# Patient Record
Sex: Female | Born: 1985 | Race: Black or African American | Hispanic: No | Marital: Single
Health system: Midwestern US, Academic
[De-identification: ages and names within clinical notes are randomized; demographics above are authoritative.]

## PROBLEM LIST (undated history)

## (undated) DIAGNOSIS — N920 Excessive and frequent menstruation with regular cycle: Secondary | ICD-10-CM

## (undated) DIAGNOSIS — D500 Iron deficiency anemia secondary to blood loss (chronic): Secondary | ICD-10-CM

## (undated) DIAGNOSIS — R300 Dysuria: Secondary | ICD-10-CM

## (undated) DIAGNOSIS — G8918 Other acute postprocedural pain: Secondary | ICD-10-CM

## (undated) DIAGNOSIS — R5383 Other fatigue: Secondary | ICD-10-CM

## (undated) DIAGNOSIS — D509 Iron deficiency anemia, unspecified: Secondary | ICD-10-CM

## (undated) DIAGNOSIS — R102 Pelvic and perineal pain: Secondary | ICD-10-CM

## (undated) HISTORY — PX: EXTERNAL EAR SURGERY: SHX627

---
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
## 2007-08-27 ENCOUNTER — Emergency Department (HOSPITAL_COMMUNITY): Admission: EM | Admit: 2007-08-27 | Discharge: 2007-08-27 | Payer: Self-pay | Admitting: *Deleted

## 2008-08-09 ENCOUNTER — Emergency Department (HOSPITAL_COMMUNITY): Admission: EM | Admit: 2008-08-09 | Discharge: 2008-08-09 | Payer: Self-pay | Admitting: Emergency Medicine

## 2008-09-17 ENCOUNTER — Emergency Department (HOSPITAL_COMMUNITY): Admission: EM | Admit: 2008-09-17 | Discharge: 2008-09-17 | Payer: Self-pay | Admitting: Otolaryngology

## 2010-02-10 ENCOUNTER — Encounter: Payer: Self-pay | Admitting: Gastroenterology

## 2010-02-11 ENCOUNTER — Encounter: Payer: Self-pay | Admitting: Gastroenterology

## 2010-04-25 ENCOUNTER — Emergency Department (HOSPITAL_COMMUNITY): Admission: EM | Admit: 2010-04-25 | Discharge: 2010-04-25 | Payer: Self-pay | Admitting: Emergency Medicine

## 2010-11-07 ENCOUNTER — Emergency Department (HOSPITAL_COMMUNITY)
Admission: EM | Admit: 2010-11-07 | Discharge: 2010-11-08 | Payer: Self-pay | Source: Home / Self Care | Admitting: Emergency Medicine

## 2011-01-04 NOTE — Medication Information (Signed)
Summary: Tax adviser   Imported By: Diana Eves 02/11/2010 09:48:32  _____________________________________________________________________  External Attachment:    Type:   Image     Comment:   External Document  Appended Document: RX Folder Please look in chart on this one. If we have not seen in two years, we cannot refill.  Appended Document: RX Folder Actually this is scanned is wrong chart. Please fix.

## 2011-01-07 NOTE — Medication Information (Signed)
Summary: Tax adviser   Imported By: Diana Eves 02/10/2010 17:02:05  _____________________________________________________________________  External Attachment:    Type:   Image     Comment:   External Document  Appended Document: RX Folder duplicate  Appended Document: RX Folder Scanned in wrong chart. Please fix.

## 2011-02-15 LAB — URINALYSIS, ROUTINE W REFLEX MICROSCOPIC
Bilirubin Urine: NEGATIVE
Nitrite: POSITIVE — AB
Specific Gravity, Urine: 1.027 (ref 1.005–1.030)
pH: 7.5 (ref 5.0–8.0)

## 2011-02-15 LAB — URINE MICROSCOPIC-ADD ON

## 2011-02-15 LAB — GC/CHLAMYDIA PROBE AMP, GENITAL: Chlamydia, DNA Probe: NEGATIVE

## 2011-02-15 LAB — WET PREP, GENITAL
Clue Cells Wet Prep HPF POC: NONE SEEN
Yeast Wet Prep HPF POC: NONE SEEN

## 2011-02-15 LAB — POCT PREGNANCY, URINE: Preg Test, Ur: NEGATIVE

## 2012-11-26 ENCOUNTER — Emergency Department (HOSPITAL_COMMUNITY)
Admission: EM | Admit: 2012-11-26 | Discharge: 2012-11-26 | Disposition: A | Discharge status: Home / Self Care | Payer: Self-pay | Attending: Emergency Medicine | Admitting: Emergency Medicine

## 2012-11-26 ENCOUNTER — Emergency Department (HOSPITAL_COMMUNITY): Payer: Self-pay

## 2012-11-26 ENCOUNTER — Encounter (HOSPITAL_COMMUNITY): Payer: Self-pay | Admitting: *Deleted

## 2012-11-26 DIAGNOSIS — IMO0001 Reserved for inherently not codable concepts without codable children: Secondary | ICD-10-CM | POA: Insufficient documentation

## 2012-11-26 DIAGNOSIS — R05 Cough: Secondary | ICD-10-CM | POA: Insufficient documentation

## 2012-11-26 DIAGNOSIS — F17200 Nicotine dependence, unspecified, uncomplicated: Secondary | ICD-10-CM | POA: Insufficient documentation

## 2012-11-26 DIAGNOSIS — J029 Acute pharyngitis, unspecified: Secondary | ICD-10-CM | POA: Insufficient documentation

## 2012-11-26 DIAGNOSIS — R5383 Other fatigue: Secondary | ICD-10-CM | POA: Insufficient documentation

## 2012-11-26 DIAGNOSIS — R109 Unspecified abdominal pain: Secondary | ICD-10-CM | POA: Insufficient documentation

## 2012-11-26 DIAGNOSIS — J111 Influenza due to unidentified influenza virus with other respiratory manifestations: Secondary | ICD-10-CM | POA: Insufficient documentation

## 2012-11-26 DIAGNOSIS — R5381 Other malaise: Secondary | ICD-10-CM | POA: Insufficient documentation

## 2012-11-26 DIAGNOSIS — R059 Cough, unspecified: Secondary | ICD-10-CM | POA: Insufficient documentation

## 2012-11-26 MED ORDER — OSELTAMIVIR PHOSPHATE 75 MG PO CAPS: 75.0000 mg | ORAL_CAPSULE | Freq: Two times a day (BID) | ORAL | Status: DC

## 2012-11-26 MED ORDER — ACETAMINOPHEN 325 MG PO TABS
650.0000 mg | ORAL_TABLET | Freq: Once | ORAL | Status: AC
  Administered 2012-11-26: 650 mg via ORAL

## 2012-11-26 MED ORDER — ACETAMINOPHEN 325 MG PO TABS
ORAL_TABLET | ORAL | Status: AC
  Administered 2012-11-26: 650 mg via ORAL
  Filled 2012-11-26: qty 2

## 2012-11-26 MED ORDER — IBUPROFEN 400 MG PO TABS: 400.0000 mg | ORAL_TABLET | Freq: Four times a day (QID) | ORAL | Status: DC | PRN

## 2012-11-26 MED ORDER — ACETAMINOPHEN 500 MG PO TABS: 500.0000 mg | ORAL_TABLET | Freq: Four times a day (QID) | ORAL | Status: DC | PRN

## 2012-11-26 NOTE — ED Provider Notes (Signed)
History     CSN: [id number]  Arrival date & time 11/26/12  1739   First MD Initiated Contact with Patient 11/26/12 2108      Chief Complaint  Patient presents with  . Fever  . Cough    (Consider location/radiation/quality/duration/timing/severity/associated sxs/prior treatment) HPI Comments: Pt with no medical hx comes in with cc of fevers, cough, myalgia, malaise. Pt states that her sx started yday. She has some URI like sx and a mild sore throat as well. She has a mild headache and stomach pain as well, but no nausea, no diarrhea. Pt has no medical problems, and has taken no fever reducer prior to arrival, no sick contacts.   Patient is a 26 y.o. female presenting with fever and cough. The history is provided by the patient.  Fever Primary symptoms of the febrile illness include fever, fatigue, cough and abdominal pain. Primary symptoms do not include wheezing, shortness of breath, nausea, vomiting or diarrhea.  Cough Associated symptoms include sore throat. Pertinent negatives include no chest pain, no shortness of breath and no wheezing.    History reviewed. No pertinent past medical history.  History reviewed. No pertinent past surgical history.  History reviewed. No pertinent family history.  History  Substance Use Topics  . Smoking status: Current Every Day Smoker    Types: Cigarettes  . Smokeless tobacco: Not on file  . Alcohol Use: Yes    OB History    Grav Para Term Preterm Abortions TAB SAB Ect Mult Living                  Review of Systems  Constitutional: Positive for fever and fatigue. Negative for activity change.  HENT: Positive for sore throat. Negative for facial swelling and neck pain.   Respiratory: Positive for cough. Negative for shortness of breath and wheezing.   Cardiovascular: Negative for chest pain.  Gastrointestinal: Positive for abdominal pain. Negative for nausea, vomiting, diarrhea, constipation, blood in stool and abdominal  distention.  Genitourinary: Negative for hematuria and difficulty urinating.  Skin: Negative for color change.  Neurological: Negative for speech difficulty.  Hematological: Does not bruise/bleed easily.  Psychiatric/Behavioral: Negative for confusion.    Allergies  Review of patient's allergies indicates no known allergies.  Home Medications   Current Outpatient Rx  Name  Route  Sig  Dispense  Refill  . ACETAMINOPHEN 160 MG/5ML PO LIQD   Oral   Take by mouth every 4 (four) hours as needed. Pain/fever.           BP 108/63  Pulse 106  Temp 99.1 F (37.3 C) (Oral)  Resp 20  Ht 5\' 5"  (1.651 m)  Wt 154 lb (69.854 kg)  BMI 25.63 kg/m2  SpO2 98%  LMP 11/12/2012  Physical Exam  Nursing note and vitals reviewed. Constitutional: She is oriented to person, place, and time. She appears well-developed.  HENT:  Head: Normocephalic and atraumatic.  Mouth/Throat: No oropharyngeal exudate.       Mild erythema of the posterior pharynx  Eyes: Conjunctivae normal and EOM are normal. Pupils are equal, round, and reactive to light.  Neck: Normal range of motion. Neck supple.  Cardiovascular: Normal rate, regular rhythm and normal heart sounds.   Pulmonary/Chest: Effort normal and breath sounds normal. No respiratory distress.  Abdominal: Soft. Bowel sounds are normal. She exhibits no distension. There is no tenderness. There is no rebound and no guarding.  Neurological: She is alert and oriented to person, place, and time.  Skin:  Skin is warm and dry.    ED Course  Procedures (including critical care time)  Labs Reviewed - No data to display Dg Chest 2 View  11/26/2012  *RADIOLOGY REPORT*  Clinical Data: Cough and fever.  CHEST - 2 VIEW  Comparison: Chest radiograph performed 08/09/2008  Findings: The lungs are well-aerated and clear.  There is no evidence of focal opacification, pleural effusion or pneumothorax.  The heart is normal in size; the mediastinal contour is within  normal limits.  No acute osseous abnormalities are seen.  Bilateral metallic nipple piercings are noted.  IMPRESSION: No acute cardiopulmonary process seen.   Original Report Authenticated By: Tonia Ghent, M.D.      No diagnosis found.    MDM  Pt comes in with several non specific complains and has a high grade fevers. She clinically has an influenza. She is healthy, and her vitals are otherwise reassuring. Will get CXR to ensure there is no PNA. We discussed the role of tamiflu - and she will decide whether to fill the rx or not on her own.   Derwood Kaplan, MD 11/26/12 2330

## 2012-11-26 NOTE — ED Notes (Signed)
Pt reports cough, fever, sore throat, and body aches since yesterday. Pt also reports vomiting today.

## 2012-11-26 NOTE — ED Notes (Signed)
Pt became sick yesterday with nasal congestion, cough, vomiting x 2, body aches, fever, and sore throat.  Pt did not get the flu shot this year.  Medicated in triage with tylenol for fever.

## 2014-03-15 ENCOUNTER — Emergency Department (HOSPITAL_COMMUNITY): Payer: Self-pay

## 2014-03-15 ENCOUNTER — Encounter (HOSPITAL_COMMUNITY): Payer: Self-pay | Admitting: Emergency Medicine

## 2014-03-15 ENCOUNTER — Emergency Department (HOSPITAL_COMMUNITY)
Admission: EM | Admit: 2014-03-15 | Discharge: 2014-03-15 | Disposition: A | Discharge status: Home / Self Care | Payer: Self-pay | Attending: Emergency Medicine | Admitting: Emergency Medicine

## 2014-03-15 DIAGNOSIS — F17200 Nicotine dependence, unspecified, uncomplicated: Secondary | ICD-10-CM | POA: Insufficient documentation

## 2014-03-15 DIAGNOSIS — R112 Nausea with vomiting, unspecified: Secondary | ICD-10-CM | POA: Insufficient documentation

## 2014-03-15 DIAGNOSIS — N390 Urinary tract infection, site not specified: Secondary | ICD-10-CM | POA: Insufficient documentation

## 2014-03-15 DIAGNOSIS — Z3202 Encounter for pregnancy test, result negative: Secondary | ICD-10-CM | POA: Insufficient documentation

## 2014-03-15 LAB — CBC WITH DIFFERENTIAL/PLATELET
Basophils Absolute: 0 10*3/uL (ref 0.0–0.1)
Basophils Relative: 0 % (ref 0–1)
EOS PCT: 1 % (ref 0–5)
Eosinophils Absolute: 0.1 10*3/uL (ref 0.0–0.7)
HCT: 35 % — ABNORMAL LOW (ref 36.0–46.0)
HEMOGLOBIN: 11.5 g/dL — AB (ref 12.0–15.0)
LYMPHS ABS: 3 10*3/uL (ref 0.7–4.0)
LYMPHS PCT: 37 % (ref 12–46)
MCH: 29.8 pg (ref 26.0–34.0)
MCHC: 32.9 g/dL (ref 30.0–36.0)
MCV: 90.7 fL (ref 78.0–100.0)
Monocytes Absolute: 0.7 10*3/uL (ref 0.1–1.0)
Monocytes Relative: 9 % (ref 3–12)
NEUTROS ABS: 4.3 10*3/uL (ref 1.7–7.7)
NEUTROS PCT: 52 % (ref 43–77)
PLATELETS: 326 10*3/uL (ref 150–400)
RBC: 3.86 MIL/uL — ABNORMAL LOW (ref 3.87–5.11)
RDW: 13.9 % (ref 11.5–15.5)
WBC: 8.1 10*3/uL (ref 4.0–10.5)

## 2014-03-15 LAB — URINE MICROSCOPIC-ADD ON

## 2014-03-15 LAB — PREGNANCY, URINE: Preg Test, Ur: NEGATIVE

## 2014-03-15 LAB — BASIC METABOLIC PANEL
BUN: 8 mg/dL (ref 6–23)
CO2: 27 meq/L (ref 19–32)
CREATININE: 0.64 mg/dL (ref 0.50–1.10)
Calcium: 9.7 mg/dL (ref 8.4–10.5)
Chloride: 101 mEq/L (ref 96–112)
GFR calc Af Amer: >90 mL/min (ref >90)
GLUCOSE: 96 mg/dL (ref 70–99)
POTASSIUM: 4 meq/L (ref 3.7–5.3)
Sodium: 140 mEq/L (ref 137–147)

## 2014-03-15 LAB — URINALYSIS, ROUTINE W REFLEX MICROSCOPIC
Bilirubin Urine: NEGATIVE
GLUCOSE, UA: NEGATIVE mg/dL
Ketones, ur: NEGATIVE mg/dL
Nitrite: POSITIVE — AB
Protein, ur: 100 mg/dL — AB
SPECIFIC GRAVITY, URINE: 1.019 (ref 1.005–1.030)
UROBILINOGEN UA: 0.2 mg/dL (ref 0.0–1.0)
pH: 6 (ref 5.0–8.0)

## 2014-03-15 MED ORDER — IOHEXOL 300 MG/ML  SOLN
50.0000 mL | Freq: Once | INTRAMUSCULAR | Status: AC | PRN
  Administered 2014-03-15: 50 mL via ORAL

## 2014-03-15 MED ORDER — OXYCODONE-ACETAMINOPHEN 5-325 MG PO TABS: 1.0000 | ORAL_TABLET | ORAL | Status: DC | PRN

## 2014-03-15 MED ORDER — CEPHALEXIN 500 MG PO CAPS: 500.0000 mg | ORAL_CAPSULE | Freq: Four times a day (QID) | ORAL | Status: DC

## 2014-03-15 MED ORDER — PIPERACILLIN-TAZOBACTAM 3.375 G IVPB
3.3750 g | Freq: Once | INTRAVENOUS | Status: AC
  Administered 2014-03-15: 3.375 g via INTRAVENOUS
  Filled 2014-03-15: qty 50

## 2014-03-15 MED ORDER — SODIUM CHLORIDE 0.9 % IV SOLN
1000.0000 mL | INTRAVENOUS | Status: DC
  Administered 2014-03-15: 1000 mL via INTRAVENOUS

## 2014-03-15 MED ORDER — ONDANSETRON 8 MG PO TBDP: 8.0000 mg | ORAL_TABLET | Freq: Three times a day (TID) | ORAL | Status: DC | PRN

## 2014-03-15 MED ORDER — MORPHINE SULFATE 4 MG/ML IJ SOLN
6.0000 mg | Freq: Once | INTRAMUSCULAR | Status: AC
  Administered 2014-03-15: 6 mg via INTRAVENOUS
  Filled 2014-03-15: qty 2

## 2014-03-15 MED ORDER — SODIUM CHLORIDE 0.9 % IV SOLN
1000.0000 mL | Freq: Once | INTRAVENOUS | Status: AC
  Administered 2014-03-15: 1000 mL via INTRAVENOUS

## 2014-03-15 MED ORDER — IOHEXOL 300 MG/ML  SOLN
100.0000 mL | Freq: Once | INTRAMUSCULAR | Status: AC | PRN
Start: 2014-03-15 — End: 2014-03-15
  Administered 2014-03-15: 100 mL via INTRAVENOUS

## 2014-03-15 NOTE — Discharge Instructions (Signed)
Urinary Tract Infection  Urinary tract infections (UTIs) can develop anywhere along your urinary tract. Your urinary tract is your body's drainage system for removing wastes and extra water. Your urinary tract includes two kidneys, two ureters, a bladder, and a urethra. Your kidneys are a pair of bean-shaped organs. Each kidney is about the size of your fist. They are located below your ribs, one on each side of your spine.  CAUSES  Infections are caused by microbes, which are microscopic organisms, including fungi, viruses, and bacteria. These organisms are so small that they can only be seen through a microscope. Bacteria are the microbes that most commonly cause UTIs.  SYMPTOMS   Symptoms of UTIs may vary by age and gender of the patient and by the location of the infection. Symptoms in young women typically include a frequent and intense urge to urinate and a painful, burning feeling in the bladder or urethra during urination. Older women and men are more likely to be tired, shaky, and weak and have muscle aches and abdominal pain. A fever may mean the infection is in your kidneys. Other symptoms of a kidney infection include pain in your back or sides below the ribs, nausea, and vomiting.  DIAGNOSIS  To diagnose a UTI, your caregiver will ask you about your symptoms. Your caregiver also will ask to provide a urine sample. The urine sample will be tested for bacteria and white blood cells. White blood cells are made by your body to help fight infection.  TREATMENT   Typically, UTIs can be treated with medication. Because most UTIs are caused by a bacterial infection, they usually can be treated with the use of antibiotics. The choice of antibiotic and length of treatment depend on your symptoms and the type of bacteria causing your infection.  HOME CARE INSTRUCTIONS   If you were prescribed antibiotics, take them exactly as your caregiver instructs you. Finish the medication even if you feel better after you  have only taken some of the medication.   Drink enough water and fluids to keep your urine clear or pale yellow.   Avoid caffeine, tea, and carbonated beverages. They tend to irritate your bladder.   Empty your bladder often. Avoid holding urine for long periods of time.   Empty your bladder before and after sexual intercourse.   After a bowel movement, women should cleanse from front to back. Use each tissue only once.  SEEK MEDICAL CARE IF:    You have back pain.   You develop a fever.   Your symptoms do not begin to resolve within 3 days.  SEEK IMMEDIATE MEDICAL CARE IF:    You have severe back pain or lower abdominal pain.   You develop chills.   You have nausea or vomiting.   You have continued burning or discomfort with urination.  MAKE SURE YOU:    Understand these instructions.   Will watch your condition.   Will get help right away if you are not doing well or get worse.  Document Released: 08/31/2005 Document Revised: 05/22/2012 Document Reviewed: 12/30/2011  [employer] Patient Information 2014 [employer].

## 2014-03-15 NOTE — ED Notes (Signed)
Patient back from CT dept 

## 2014-03-15 NOTE — ED Notes (Signed)
Pt arrived to the Ed with a complaint of right lower abdominal pain.  Pt states the pain has been present since Wednesday.  Pt has nausea and vomiting.  Pt states pain is a tightness.

## 2014-03-15 NOTE — ED Provider Notes (Signed)
CSN: [id number]     Arrival date & time 03/15/14  0306 History   First MD Initiated Contact with Patient 03/15/14 0347     Chief Complaint  Patient presents with  . Abdominal Pain      HPI Patient presents emergency department because of a complaint of ongoing right lower quadrant abdominal pain over the past 3 days.  Her pain is been constant.  She states nausea and vomiting.  She denies flank pain.  She describes this sensation as a tightness in her right lower quadrant it is worsened by movement and palpation.  She denies urinary or vaginal complaints.  No prior history of abdominal pain like this.  Her pain is moderate to severe in severity at this time.  Her pain is worsened by movement and palpation   History reviewed. No pertinent past medical history. History reviewed. No pertinent past surgical history. History reviewed. No pertinent family history. History  Substance Use Topics  . Smoking status: Current Every Day Smoker    Types: Cigarettes  . Smokeless tobacco: Not on file  . Alcohol Use: Yes   OB History   Grav Para Term Preterm Abortions TAB SAB Ect Mult Living                 Review of Systems  All other systems reviewed and are negative.     Allergies  Review of patient's allergies indicates no known allergies.  Home Medications   Current Outpatient Rx  Name  Route  Sig  Dispense  Refill  . traMADol (ULTRAM) 50 MG tablet   Oral   Take 100 mg by mouth every 12 (twelve) hours as needed for moderate pain.          BP 114/67  Pulse 73  Temp(Src) 98.9 F (37.2 C) (Oral)  Resp 20  SpO2 98%  LMP 03/15/2014 Physical Exam  Nursing note and vitals reviewed. Constitutional: She is oriented to person, place, and time. She appears well-developed and well-nourished. No distress.  HENT:  Head: Normocephalic and atraumatic.  Eyes: EOM are normal.  Neck: Normal range of motion.  Cardiovascular: Normal rate, regular rhythm and normal heart sounds.    Pulmonary/Chest: Effort normal and breath sounds normal.  Abdominal: Soft. She exhibits no distension.  Right lower quadrant tenderness without peritonitis  Musculoskeletal: Normal range of motion.  Neurological: She is alert and oriented to person, place, and time.  Skin: Skin is warm and dry.  Psychiatric: She has a normal mood and affect. Judgment normal.    ED Course  Procedures (including critical care time) Labs Review Labs Reviewed  URINALYSIS, ROUTINE W REFLEX MICROSCOPIC - Abnormal; Notable for the following:    APPearance CLOUDY (*)    Hgb urine dipstick LARGE (*)    Protein, ur 100 (*)    Nitrite POSITIVE (*)    Leukocytes, UA LARGE (*)    All other components within normal limits  CBC WITH DIFFERENTIAL - Abnormal; Notable for the following:    RBC 3.86 (*)    Hemoglobin 11.5 (*)    HCT 35.0 (*)    All other components within normal limits  URINE MICROSCOPIC-ADD ON - Abnormal; Notable for the following:    Bacteria, UA MANY (*)    All other components within normal limits  PREGNANCY, URINE  BASIC METABOLIC PANEL   Imaging Review Ct Abdomen Pelvis W Contrast  03/15/2014   CLINICAL DATA:  Right lower quadrant pain  EXAM: CT ABDOMEN AND PELVIS WITH  CONTRAST  TECHNIQUE: Multidetector CT imaging of the abdomen and pelvis was performed using the standard protocol following bolus administration of intravenous contrast.  CONTRAST:  138mL OMNIPAQUE IOHEXOL 300 MG/ML  SOLN  COMPARISON:  None.  FINDINGS: Visualized lung bases are clear.  The liver demonstrates a normal contrast enhanced appearance. Mild periportal edema noted. Gallbladder within normal limits. No biliary ductal dilatation. The spleen, adrenal glands, and pancreas demonstrate a normal contrast enhanced appearance.  1.2 cm dystrophic calcification within the right renal parenchyma noted, which may represent sequelae of remote infection, trauma, or possibly a calcified involuted cyst. There is circumferential  enhancement with mild wall thickening about the right renal pelvis and proximal right ureter (series 2, image 26), suggestive of possible urinary tract infection. No hydronephrosis. The right renal nephrogram is within normal limits without frank evidence of pyelonephritis.  No evidence of bowel obstruction. Appendix is visualized in the right lower quadrant and is of normal caliber and appearance without associated inflammatory changes to suggest acute appendicitis. No abnormal wall thickening, mucosal enhancement, or inflammatory fat stranding seen about the bowels.  Bladder is within normal limits. Uterus and ovaries are unremarkable.  No free air or fluid. No enlarged intra-abdominal pelvic lymph nodes. Normal intravascular enhancement seen within the intra-abdominal aorta and its branch vessels. Circumaortic left renal vein noted.  No acute osseous abnormality. No worrisome lytic or blastic osseous lesions.  IMPRESSION: 1. Abnormal wall thickening and enhancement about the right renal pelvis and right ureter, suspicious for right-sided upper urinary tract infection. No overt CT evidence of pyelonephritis within the right kidney itself. 2. No other acute intra-abdominal pelvic process.  Normal appendix.   Electronically Signed   By: Jeannine Boga M.D.   On: 03/15/2014 06:08  I personally reviewed the imaging tests through PACS system I reviewed available ER/hospitalization records through the EMR    EKG Interpretation None      MDM   Final diagnoses:  None    Patient.  There urinary tract infection with what appears to be a standing infection of her right ureter.  Clinically with chills nausea vomiting she has developed pyelonephritis.  IV antibiotics given in the emergency department.  Urine culture sent.  Discharge home with pain medicine, and nausea medicine, antibiotics x1 week.    Hoy Morn, MD 03/15/14 [phone]

## 2014-03-15 NOTE — ED Notes (Signed)
Patient transported to CT 

## 2014-03-15 NOTE — ED Notes (Signed)
MD at bedside. 

## 2014-03-15 NOTE — ED Notes (Signed)
Patient is alert and oriented x3.  She was given DC instructions and follow up visit instructions.  Patient gave verbal understanding. She was DC ambulatory under her own power to home.  V/S stable.  He was not showing any signs of distress on DC 

## 2014-09-27 ENCOUNTER — Encounter (HOSPITAL_COMMUNITY): Payer: Self-pay | Admitting: Emergency Medicine

## 2014-09-27 ENCOUNTER — Emergency Department (HOSPITAL_COMMUNITY)
Admission: EM | Admit: 2014-09-27 | Discharge: 2014-09-27 | Disposition: A | Discharge status: Home / Self Care | Payer: Self-pay | Attending: Emergency Medicine | Admitting: Emergency Medicine

## 2014-09-27 DIAGNOSIS — Z720 Tobacco use: Secondary | ICD-10-CM | POA: Insufficient documentation

## 2014-09-27 DIAGNOSIS — L509 Urticaria, unspecified: Secondary | ICD-10-CM | POA: Insufficient documentation

## 2014-09-27 MED ORDER — PREDNISONE 20 MG PO TABS
60.0000 mg | ORAL_TABLET | Freq: Once | ORAL | Status: AC
  Administered 2014-09-27: 60 mg via ORAL
  Filled 2014-09-27: qty 3

## 2014-09-27 MED ORDER — DIPHENHYDRAMINE HCL 25 MG PO TABS: 25.0000 mg | ORAL_TABLET | Freq: Four times a day (QID) | ORAL | Status: DC | PRN

## 2014-09-27 MED ORDER — DIPHENHYDRAMINE HCL 25 MG PO CAPS
25.0000 mg | ORAL_CAPSULE | Freq: Once | ORAL | Status: AC
  Administered 2014-09-27: 25 mg via ORAL
  Filled 2014-09-27: qty 1

## 2014-09-27 MED ORDER — FAMOTIDINE 20 MG PO TABS
20.0000 mg | ORAL_TABLET | Freq: Once | ORAL | Status: AC
  Administered 2014-09-27: 20 mg via ORAL
  Filled 2014-09-27: qty 1

## 2014-09-27 MED ORDER — PREDNISONE 20 MG PO TABS
40.0000 mg | ORAL_TABLET | Freq: Every day | ORAL | Status: DC
Start: 2014-09-27 — End: 2014-11-01

## 2014-09-27 NOTE — Discharge Instructions (Signed)
Take prednisone and benadryl as needed for rash. Refer to attached documents for more information. Return to the ED with worsening or concerning symptoms.

## 2014-09-27 NOTE — ED Provider Notes (Signed)
CSN: [id number]     Arrival date & time 09/27/14  2022 History   First MD Initiated Contact with Patient 09/27/14 2108     Chief Complaint  Patient presents with  . Rash     (Consider location/radiation/quality/duration/timing/severity/associated sxs/prior Treatment) HPI Comments: Patient is a 28 year old female who presents with a rash that started last night. The rash started gradually and progressively worsened since the onset. The rash is located on face, back and legs. Patient has tried nothing without relief. Patient denies new exposures to medications, soaps, lotions, detergent. Patient reports associated itching. No aggravating/alleviating factors. Patient denies fever, chills, NVD, sore throat, oral lesions, ocular involvement, throat closing, wheezing, SOB, chest pain, abdominal pain.      History reviewed. No pertinent past medical history. History reviewed. No pertinent past surgical history. No family history on file. History  Substance Use Topics  . Smoking status: Current Every Day Smoker    Types: Cigarettes  . Smokeless tobacco: Not on file  . Alcohol Use: Yes   OB History   Grav Para Term Preterm Abortions TAB SAB Ect Mult Living                 Review of Systems  Constitutional: Negative for fever, chills and fatigue.  HENT: Negative for trouble swallowing.   Eyes: Negative for visual disturbance.  Respiratory: Negative for shortness of breath.   Cardiovascular: Negative for chest pain and palpitations.  Gastrointestinal: Negative for nausea, vomiting, abdominal pain and diarrhea.  Genitourinary: Negative for dysuria and difficulty urinating.  Musculoskeletal: Negative for arthralgias and neck pain.  Skin: Positive for rash. Negative for color change.  Neurological: Negative for dizziness and weakness.  Psychiatric/Behavioral: Negative for dysphoric mood.      Allergies  Review of patient's allergies indicates no known allergies.  Home Medications    Prior to Admission medications   Not on File   BP 128/77  Pulse 76  Temp(Src) 98.7 F (37.1 C) (Oral)  Resp 18  Ht 5\' 5"  (1.651 m)  Wt 155 lb (70.308 kg)  BMI 25.79 kg/m2  SpO2 100%  LMP 09/25/2014 Physical Exam  Nursing note and vitals reviewed. Constitutional: She is oriented to person, place, and time. She appears well-developed and well-nourished. No distress.  HENT:  Head: Normocephalic and atraumatic.  Eyes: Conjunctivae and EOM are normal.  Neck: Normal range of motion.  Cardiovascular: Normal rate and regular rhythm.  Exam reveals no gallop and no friction rub.   No murmur heard. Pulmonary/Chest: Effort normal and breath sounds normal. No respiratory distress. She has no wheezes. She has no rales. She exhibits no tenderness.  Abdominal: Soft. There is no tenderness.  Musculoskeletal: Normal range of motion.  Neurological: She is alert and oriented to person, place, and time.  Speech is goal-oriented. Moves limbs without ataxia.   Skin: Skin is warm and dry.  Scattered pruritic, urticarial lesions of right face, back and bilateral legs.   Psychiatric: She has a normal mood and affect. Her behavior is normal.    ED Course  Procedures (including critical care time) Labs Review Labs Reviewed - No data to display  Imaging Review No results found.   EKG Interpretation None      MDM   Final diagnoses:  Urticarial rash    9:12 PM Patient has scattered urticarial lesions of face, back and legs. Patient has no trouble breathing for wheezing. Patient will have PO prednisone, benadryl, and pepcid. Patient will be discharged with prednisone  and benadryl. Patient instructed to return with worsening or concerning symptoms. Vitals stable and patient afebrile.     Alvina Chou, PA-C 09/27/14 2248

## 2014-09-27 NOTE — ED Notes (Signed)
Pt reports painful itchy hives all over her body and face which started last night.  Hives varies in sizes.  Pt reports today the one in R side of her face became so large that her whole R side of her face became numb.

## 2014-09-28 NOTE — ED Provider Notes (Signed)
Medical screening examination/treatment/procedure(s) were performed by non-physician practitioner and as supervising physician I was immediately available for consultation/collaboration.   EKG Interpretation None       Varney Biles, MD 09/28/14 0122

## 2014-10-08 ENCOUNTER — Encounter (HOSPITAL_COMMUNITY): Payer: Self-pay | Admitting: Emergency Medicine

## 2014-10-08 ENCOUNTER — Emergency Department (HOSPITAL_COMMUNITY)
Admission: EM | Admit: 2014-10-08 | Discharge: 2014-10-09 | Disposition: A | Discharge status: Home / Self Care | Payer: Medicaid Other | Attending: Emergency Medicine | Admitting: Emergency Medicine

## 2014-10-08 DIAGNOSIS — Z3A01 Less than 8 weeks gestation of pregnancy: Secondary | ICD-10-CM | POA: Insufficient documentation

## 2014-10-08 DIAGNOSIS — Z7952 Long term (current) use of systemic steroids: Secondary | ICD-10-CM | POA: Diagnosis not present

## 2014-10-08 DIAGNOSIS — O26899 Other specified pregnancy related conditions, unspecified trimester: Secondary | ICD-10-CM

## 2014-10-08 DIAGNOSIS — F17210 Nicotine dependence, cigarettes, uncomplicated: Secondary | ICD-10-CM | POA: Insufficient documentation

## 2014-10-08 DIAGNOSIS — A5901 Trichomonal vulvovaginitis: Secondary | ICD-10-CM

## 2014-10-08 DIAGNOSIS — A5900 Urogenital trichomoniasis, unspecified: Secondary | ICD-10-CM | POA: Insufficient documentation

## 2014-10-08 DIAGNOSIS — Z3491 Encounter for supervision of normal pregnancy, unspecified, first trimester: Secondary | ICD-10-CM

## 2014-10-08 DIAGNOSIS — O99331 Smoking (tobacco) complicating pregnancy, first trimester: Secondary | ICD-10-CM | POA: Diagnosis not present

## 2014-10-08 DIAGNOSIS — O98911 Unspecified maternal infectious and parasitic disease complicating pregnancy, first trimester: Secondary | ICD-10-CM | POA: Insufficient documentation

## 2014-10-08 DIAGNOSIS — R102 Pelvic and perineal pain: Secondary | ICD-10-CM

## 2014-10-08 NOTE — ED Notes (Signed)
Pt states she took a home preg test yesterday + result, today began having low abd pain with pink spotting today.

## 2014-10-09 ENCOUNTER — Emergency Department (HOSPITAL_COMMUNITY): Payer: Medicaid Other

## 2014-10-09 LAB — URINALYSIS, ROUTINE W REFLEX MICROSCOPIC
Bilirubin Urine: NEGATIVE
GLUCOSE, UA: NEGATIVE mg/dL
KETONES UR: NEGATIVE mg/dL
NITRITE: NEGATIVE
PROTEIN: NEGATIVE mg/dL
Specific Gravity, Urine: 1.023 (ref 1.005–1.030)
Urobilinogen, UA: 0.2 mg/dL (ref 0.0–1.0)
pH: 7 (ref 5.0–8.0)

## 2014-10-09 LAB — URINE MICROSCOPIC-ADD ON

## 2014-10-09 LAB — WET PREP, GENITAL
CLUE CELLS WET PREP: NONE SEEN
Yeast Wet Prep HPF POC: NONE SEEN

## 2014-10-09 LAB — POC URINE PREG, ED: PREG TEST UR: POSITIVE — AB

## 2014-10-09 LAB — ABO/RH: ABO/RH(D): B POS

## 2014-10-09 LAB — HCG, QUANTITATIVE, PREGNANCY: hCG, Beta Chain, Quant, S: 9834 m[IU]/mL — ABNORMAL HIGH (ref <5)

## 2014-10-09 LAB — HIV ANTIBODY (ROUTINE TESTING W REFLEX): HIV 1&2 Ab, 4th Generation: NONREACTIVE

## 2014-10-09 MED ORDER — PRENATAL VITAMINS 28-0.8 MG PO TABS: 1.0000 | ORAL_TABLET | Freq: Every day | ORAL | Status: DC

## 2014-10-09 MED ORDER — METRONIDAZOLE 500 MG PO TABS
2000.0000 mg | ORAL_TABLET | Freq: Once | ORAL | Status: AC
  Administered 2014-10-09: 2000 mg via ORAL
  Filled 2014-10-09: qty 4

## 2014-10-09 NOTE — Discharge Instructions (Signed)

## 2014-10-09 NOTE — ED Provider Notes (Signed)
CSN: [id number]     Arrival date & time 10/08/14  2149 History   First MD Initiated Contact with Patient 10/09/14 [phone]     Chief Complaint  Patient presents with  . Abdominal Pain     (Consider location/radiation/quality/duration/timing/severity/associated sxs/prior Treatment) Patient is a 28 y.o. female presenting with abdominal pain. The history is provided by the patient. No language interpreter was used.  Abdominal Pain Pain location:  Suprapubic Pain quality: aching and cramping   Pain radiates to:  Does not radiate Pain severity:  Mild Duration:  1 day Associated symptoms: hematuria and nausea   Associated symptoms: no chills, no dysuria, no fever, no vaginal bleeding, no vaginal discharge and no vomiting   Associated symptoms comment:  She reports blood on the tissue paper after urinating this morning. No urinary frequency or dysuria. No vaginal bleeding or discharge. No fever. She took a home pregnancy test yesterday and states it was positive. G1P0. She has experienced nausea without vomiting.    History reviewed. No pertinent past medical history. Past Surgical History  Procedure Laterality Date  . External ear surgery     No family history on file. History  Substance Use Topics  . Smoking status: Current Every Day Smoker    Types: Cigarettes  . Smokeless tobacco: Not on file  . Alcohol Use: Yes   OB History    No data available     Review of Systems  Constitutional: Negative for fever and chills.  Respiratory: Negative.   Cardiovascular: Negative.   Gastrointestinal: Positive for nausea and abdominal pain. Negative for vomiting.  Genitourinary: Positive for hematuria. Negative for dysuria, vaginal bleeding and vaginal discharge.  Musculoskeletal: Negative.   Skin: Negative.   Neurological: Negative.       Allergies  Review of patient's allergies indicates no known allergies.  Home Medications   Prior to Admission medications   Medication Sig Start  Date End Date Taking? Authorizing Provider  diphenhydrAMINE (BENADRYL) 25 MG tablet Take 1 tablet (25 mg total) by mouth every 6 (six) hours as needed for itching (Rash). 09/27/14   Kaitlyn Szekalski, PA-C  predniSONE (DELTASONE) 20 MG tablet Take 2 tablets (40 mg total) by mouth daily. Take 40 mg by mouth daily for 3 days, then 20mg  by mouth daily for 3 days, then 10mg  daily for 3 days 09/27/14   Kaitlyn Szekalski, PA-C   BP 129/71 mmHg  Pulse 77  Temp(Src) 98.6 F (37 C) (Oral)  Resp 18  Ht 5\' 5"  (1.651 m)  SpO2 100%  LMP 09/25/2014 Physical Exam  Constitutional: She is oriented to person, place, and time. She appears well-developed and well-nourished.  Neck: Normal range of motion.  Pulmonary/Chest: Effort normal.  Abdominal: Soft. She exhibits no distension and no mass.  Suprapubic tenderness.   Genitourinary:  Cervix unremarkable in appearance. Non-tender. No cervical bleeding. There is moderate greenish discharge in the vaginal vault. Mild left adnexal tenderness without mass. No right adnexal mass or tenderness.   Musculoskeletal: Normal range of motion.  Neurological: She is alert and oriented to person, place, and time.  Skin: Skin is warm and dry.  Psychiatric: She has a normal mood and affect.    ED Course  Procedures (including critical care time) Labs Review Labs Reviewed  POC URINE PREG, ED - Abnormal; Notable for the following:    Preg Test, Ur POSITIVE (*)    All other components within normal limits  URINALYSIS, ROUTINE W REFLEX MICROSCOPIC    Imaging Review No  results found.   EKG Interpretation None      MDM   Final diagnoses:  None    1. IUP 2. Trichomonas vaginitis  Trichomonas treated with Flagyl per pharmacy recommendation. She will be referred to [facility] for prenatal care. No evidence to suggest active miscarriage.   Dewaine Oats, PA-C 10/09/14 7847  Julianne Rice, MD 10/10/14 [phone]

## 2014-10-10 LAB — GC/CHLAMYDIA PROBE AMP
CT Probe RNA: POSITIVE — AB
GC Probe RNA: NEGATIVE

## 2014-10-11 ENCOUNTER — Telehealth: Payer: Self-pay | Admitting: Emergency Medicine

## 2014-10-11 NOTE — Telephone Encounter (Signed)
Positive Chlamydia culture Chart sent to EDP for review 

## 2014-10-13 ENCOUNTER — Telehealth (HOSPITAL_COMMUNITY): Payer: Self-pay

## 2014-10-13 NOTE — ED Notes (Signed)
Chart has returned from [place] office. Attempted call x 1.

## 2014-10-14 ENCOUNTER — Telehealth (HOSPITAL_COMMUNITY): Payer: Self-pay

## 2014-10-15 ENCOUNTER — Telehealth: Payer: Self-pay | Admitting: Emergency Medicine

## 2014-10-21 ENCOUNTER — Encounter (HOSPITAL_COMMUNITY): Payer: Self-pay | Admitting: *Deleted

## 2014-10-21 ENCOUNTER — Inpatient Hospital Stay (HOSPITAL_COMMUNITY)
Admission: AD | Admit: 2014-10-21 | Discharge: 2014-10-22 | Disposition: A | Discharge status: Home / Self Care | Payer: Medicaid Other | Source: Ambulatory Visit | Attending: Obstetrics & Gynecology | Admitting: Obstetrics & Gynecology

## 2014-10-21 DIAGNOSIS — R109 Unspecified abdominal pain: Secondary | ICD-10-CM

## 2014-10-21 DIAGNOSIS — O26899 Other specified pregnancy related conditions, unspecified trimester: Secondary | ICD-10-CM

## 2014-10-21 DIAGNOSIS — O021 Missed abortion: Secondary | ICD-10-CM | POA: Insufficient documentation

## 2014-10-21 DIAGNOSIS — Z3A01 Less than 8 weeks gestation of pregnancy: Secondary | ICD-10-CM | POA: Insufficient documentation

## 2014-10-21 NOTE — MAU Note (Signed)
PT SAYS  SHE WENT TO [facility]- ON 11-4-      AFTER SHE   DID HPT- ON 11-3-  POSITIVE.     SHE WAS HAVING  LOWER CRAMPS AND SPOTTING -    THEY DID LABS  AND U/S.-     ALL OK.      THEN YESTERDY- STARTED   HAVING CRAMPS  AND TODAY   WORSE .  NO BLEEDING.    LAST SEX-    SEPT.   PLANS TO GET  PNC WITH [facility]- HAS AN APPOINTMENT ON 11-19

## 2014-10-22 ENCOUNTER — Inpatient Hospital Stay (HOSPITAL_COMMUNITY): Payer: Medicaid Other

## 2014-10-22 DIAGNOSIS — Z3A01 Less than 8 weeks gestation of pregnancy: Secondary | ICD-10-CM | POA: Diagnosis not present

## 2014-10-22 DIAGNOSIS — O021 Missed abortion: Secondary | ICD-10-CM | POA: Diagnosis not present

## 2014-10-22 DIAGNOSIS — O9989 Other specified diseases and conditions complicating pregnancy, childbirth and the puerperium: Secondary | ICD-10-CM

## 2014-10-22 DIAGNOSIS — R1030 Lower abdominal pain, unspecified: Secondary | ICD-10-CM | POA: Diagnosis present

## 2014-10-22 DIAGNOSIS — R109 Unspecified abdominal pain: Secondary | ICD-10-CM

## 2014-10-22 LAB — CBC WITH DIFFERENTIAL/PLATELET
BASOS ABS: 0 10*3/uL (ref 0.0–0.1)
Basophils Relative: 0 % (ref 0–1)
Eosinophils Absolute: 0.3 10*3/uL (ref 0.0–0.7)
Eosinophils Relative: 3 % (ref 0–5)
HEMATOCRIT: 33.8 % — AB (ref 36.0–46.0)
HEMOGLOBIN: 11.2 g/dL — AB (ref 12.0–15.0)
LYMPHS ABS: 3.5 10*3/uL (ref 0.7–4.0)
LYMPHS PCT: 38 % (ref 12–46)
MCH: 29.2 pg (ref 26.0–34.0)
MCHC: 33.1 g/dL (ref 30.0–36.0)
MCV: 88 fL (ref 78.0–100.0)
MONO ABS: 0.5 10*3/uL (ref 0.1–1.0)
Monocytes Relative: 6 % (ref 3–12)
Neutro Abs: 4.9 10*3/uL (ref 1.7–7.7)
Neutrophils Relative %: 53 % (ref 43–77)
Platelets: 300 10*3/uL (ref 150–400)
RBC: 3.84 MIL/uL — ABNORMAL LOW (ref 3.87–5.11)
RDW: 16.1 % — AB (ref 11.5–15.5)
WBC: 9.1 10*3/uL (ref 4.0–10.5)

## 2014-10-22 LAB — HCG, QUANTITATIVE, PREGNANCY: hCG, Beta Chain, Quant, S: 22735 m[IU]/mL — ABNORMAL HIGH (ref <5)

## 2014-10-22 MED ORDER — OXYCODONE-ACETAMINOPHEN 5-325 MG PO TABS: 1.0000 | ORAL_TABLET | ORAL | Status: DC | PRN

## 2014-10-22 NOTE — MAU Provider Note (Signed)
Ms. MIRIYA CLOER is a 28 y.o. G2P0 at [redacted]w[redacted]d who presents to MAU today with complaint of lower abdominal pain. The patient states that pain started last night but has become much worse today. She denise vaginal bleeding, discharge, N/V or fever. Patient had [place] at [facility] on 10/09/14 that showed IUGS and YS  BP 127/67 mmHg  Pulse 76  Temp(Src) 98.3 F (36.8 C) (Oral)  Ht 5\' 5"  (1.651 m)  Wt 156 lb 6 oz (70.931 kg)  BMI 26.02 kg/m2  LMP 09/25/2014 GENERAL: Well-developed, well-nourished female in no acute distress.  HEENT: Normocephalic, atraumatic.   LUNGS: Effort normal HEART: Regular rate ABDOMEN: moderate tenderness to palpation of the lower abdomen without rebound or guarding SKIN: Warm, dry and without erythema PSYCH: Normal mood and affect   Results for orders placed or performed during the hospital encounter of 10/21/14 (from the past 24 hour(s))  CBC with Differential     Status: Abnormal   Collection Time: 10/22/14  1:25 AM  Result Value Ref Range   WBC 9.1 4.0 - 10.5 K/uL   RBC 3.84 (L) 3.87 - 5.11 MIL/uL   Hemoglobin 11.2 (L) 12.0 - 15.0 g/dL   HCT 33.8 (L) 36.0 - 46.0 %   MCV 88.0 78.0 - 100.0 fL   MCH 29.2 26.0 - 34.0 pg   MCHC 33.1 30.0 - 36.0 g/dL   RDW 16.1 (H) 11.5 - 15.5 %   Platelets 300 150 - 400 K/uL   Neutrophils Relative % 53 43 - 77 %   Neutro Abs 4.9 1.7 - 7.7 K/uL   Lymphocytes Relative 38 12 - 46 %   Lymphs Abs 3.5 0.7 - 4.0 K/uL   Monocytes Relative 6 3 - 12 %   Monocytes Absolute 0.5 0.1 - 1.0 K/uL   Eosinophils Relative 3 0 - 5 %   Eosinophils Absolute 0.3 0.0 - 0.7 K/uL   Basophils Relative 0 0 - 1 %   Basophils Absolute 0.0 0.0 - 0.1 K/uL  hCG, quantitative, pregnancy     Status: Abnormal   Collection Time: 10/22/14  1:25 AM  Result Value Ref Range   hCG, Beta Chain, Quant, S 22735 (H) <5 mIU/mL    US Ob Transvaginal  10/22/2014   CLINICAL DATA:  Pelvic pain. Followup. Estimated gestational age by LMP is 7 weeks 3 days. Quantitative  beta HCG was not ordered today.  EXAM: TRANSVAGINAL OB ULTRASOUND  TECHNIQUE: Transvaginal ultrasound was performed for complete evaluation of the gestation as well as the maternal uterus, adnexal regions, and pelvic cul-de-sac.  COMPARISON:  10/09/2014  FINDINGS: Intrauterine gestational sac: A single intrauterine gestational sac is present.  Yolk sac:  Yolk sac is visualized.  Embryo:  Fetal pole is not identified.  Cardiac Activity: Not identified.  MSD: 18.4  mm   6 w   6  d                  [place] EDC: 06/11/2015  Maternal uterus/adnexae: Uterus appears slightly retroverted. There is focal intramural myometrial mass lesion demonstrated measuring about 1.4 cm diameter. This likely represents a small fibroid. No subchorionic hemorrhage. Both ovaries are identified and appear normal. Corpus luteum cyst noted on the left. No free pelvic fluid collections.  IMPRESSION: Single intrauterine gestational sac with yolk sac visualized. Fetal pole is not identified. Absence of embryo greater than 10 days after ultrasound showing gestational sac with yolk sac meet criteria for failed pregnancy. Findings meet definitive criteria for failed  pregnancy. This follows SRU consensus guidelines: Diagnostic Criteria for Nonviable Pregnancy Early in the First Trimester. Alison Stalling J Med [phone].   Electronically Signed   By: Lucienne Capers M.D.   On: 10/22/2014 01:17   MDM Discussed options expectant management vs cytotec vs D&C for management of failed pregnancy. Patient is visibly upset and unsure of choice for management at this time. Will follow-up with [facility] as planned on 10/23/14 Comfort care information given  A: Missed ABat [redacted]w[redacted]d  P: Discharge home Rx for Percocet given to patient Bleeding precautions discussed Patient has appointment with [place] on 10/23/14 and will plan to follow-up then as scheduled Patient may return to MAU as needed or if her condition were to change or worsen  Luvenia Redden, PA-C   10/22/2014 2:48 AM

## 2014-10-22 NOTE — Discharge Instructions (Signed)
Incomplete Miscarriage A miscarriage is the sudden loss of an unborn baby (fetus) before the 20th week of pregnancy. In an incomplete miscarriage, parts of the fetus or placenta (afterbirth) remain in the body.  Having a miscarriage can be an emotional experience. Talk with your health care provider about any questions you may have about miscarrying, the grieving process, and your future pregnancy plans. CAUSES   Problems with the fetal chromosomes that make it impossible for the baby to develop normally. Problems with the baby's genes or chromosomes are most often the result of errors that occur by chance as the embryo divides and grows. The problems are not inherited from the parents.  Infection of the cervix or uterus.  Hormone problems.  Problems with the cervix, such as having an incompetent cervix. This is when the tissue in the cervix is not strong enough to hold the pregnancy.  Problems with the uterus, such as an abnormally shaped uterus, uterine fibroids, or congenital abnormalities.  Certain medical conditions.  Smoking, drinking alcohol, or taking illegal drugs.  Trauma. SYMPTOMS   Vaginal bleeding or spotting, with or without cramps or pain.  Pain or cramping in the abdomen or lower back.  Passing fluid, tissue, or blood clots from the vagina. DIAGNOSIS  Your health care provider will perform a physical exam. You may also have an ultrasound to confirm the miscarriage. Blood or urine tests may also be ordered. TREATMENT   Usually, a dilation and curettage (D&C) procedure is performed. During a D&C procedure, the cervix is widened (dilated) and any remaining fetal or placental tissue is gently removed from the uterus.  Antibiotic medicines are prescribed if there is an infection. Other medicines may be given to reduce the size of the uterus (contract) if there is a lot of bleeding.  If you have Rh negative blood and your baby was Rh positive, you will need a Rho (D)  immune globulin shot. This shot will protect any future baby from having Rh blood problems in future pregnancies.  You may be confined to bed rest. This means you should stay in bed and only get up to use the bathroom. HOME CARE INSTRUCTIONS   Rest as directed by your health care provider.  Restrict activity as directed by your health care provider. You may be allowed to continue light activity if curettage was not done but you require further treatment.  Keep track of the number of pads you use each day. Keep track of how soaked (saturated) they are. Record this information.  Do not  use tampons.  Do not douche or have sexual intercourse until approved by your health care provider.  Keep all follow-up appointments for reevaluation and continuing management.  Only take over-the-counter or prescription medicines for pain, fever, or discomfort as directed by your health care provider.  Take antibiotic medicine as directed by your health care provider. Make sure you finish it even if you start to feel better. SEEK IMMEDIATE MEDICAL CARE IF:   You experience severe cramps in your stomach, back, or abdomen.  You have an unexplained temperature (make sure to record these temperatures).  You pass large clots or tissue (save these for your health care provider to inspect).  Your bleeding increases.  You become light-headed, weak, or have fainting episodes. MAKE SURE YOU:   Understand these instructions.  Will watch your condition.  Will get help right away if you are not doing well or get worse. Document Released: 11/21/2005 Document Revised: 04/07/2014 Document Reviewed:   06/20/2013 ExitCare Patient Information 2015 [employer]. This information is not intended to replace advice given to you by your health care provider. Make sure you discuss any questions you have with your health care provider.  

## 2014-10-27 ENCOUNTER — Telehealth (HOSPITAL_BASED_OUTPATIENT_CLINIC_OR_DEPARTMENT_OTHER): Payer: Self-pay | Admitting: *Deleted

## 2014-11-01 ENCOUNTER — Encounter (HOSPITAL_COMMUNITY): Payer: Self-pay | Admitting: *Deleted

## 2014-11-01 ENCOUNTER — Inpatient Hospital Stay (HOSPITAL_COMMUNITY): Payer: Medicaid Other

## 2014-11-01 ENCOUNTER — Observation Stay (HOSPITAL_COMMUNITY)
Admission: AD | Admit: 2014-11-01 | Discharge: 2014-11-01 | Disposition: A | Discharge status: Home / Self Care | Payer: Medicaid Other | Source: Ambulatory Visit | Attending: Obstetrics and Gynecology | Admitting: Obstetrics and Gynecology

## 2014-11-01 DIAGNOSIS — A749 Chlamydial infection, unspecified: Secondary | ICD-10-CM | POA: Diagnosis present

## 2014-11-01 DIAGNOSIS — O99330 Smoking (tobacco) complicating pregnancy, unspecified trimester: Secondary | ICD-10-CM | POA: Insufficient documentation

## 2014-11-01 DIAGNOSIS — O020 Blighted ovum and nonhydatidiform mole: Principal | ICD-10-CM | POA: Insufficient documentation

## 2014-11-01 DIAGNOSIS — N939 Abnormal uterine and vaginal bleeding, unspecified: Secondary | ICD-10-CM | POA: Diagnosis present

## 2014-11-01 DIAGNOSIS — R109 Unspecified abdominal pain: Secondary | ICD-10-CM | POA: Insufficient documentation

## 2014-11-01 DIAGNOSIS — Z3A00 Weeks of gestation of pregnancy not specified: Secondary | ICD-10-CM | POA: Diagnosis not present

## 2014-11-01 DIAGNOSIS — O26899 Other specified pregnancy related conditions, unspecified trimester: Secondary | ICD-10-CM | POA: Diagnosis present

## 2014-11-01 DIAGNOSIS — O039 Complete or unspecified spontaneous abortion without complication: Secondary | ICD-10-CM | POA: Diagnosis present

## 2014-11-01 DIAGNOSIS — F17200 Nicotine dependence, unspecified, uncomplicated: Secondary | ICD-10-CM | POA: Diagnosis present

## 2014-11-01 DIAGNOSIS — D219 Benign neoplasm of connective and other soft tissue, unspecified: Secondary | ICD-10-CM | POA: Diagnosis present

## 2014-11-01 DIAGNOSIS — F17210 Nicotine dependence, cigarettes, uncomplicated: Secondary | ICD-10-CM | POA: Insufficient documentation

## 2014-11-01 LAB — URINALYSIS, ROUTINE W REFLEX MICROSCOPIC
BILIRUBIN URINE: NEGATIVE
Glucose, UA: NEGATIVE mg/dL
KETONES UR: NEGATIVE mg/dL
Leukocytes, UA: NEGATIVE
Nitrite: NEGATIVE
PH: 7 (ref 5.0–8.0)
Protein, ur: NEGATIVE mg/dL
SPECIFIC GRAVITY, URINE: 1.02 (ref 1.005–1.030)
Urobilinogen, UA: 0.2 mg/dL (ref 0.0–1.0)

## 2014-11-01 LAB — URINE MICROSCOPIC-ADD ON

## 2014-11-01 LAB — HCG, QUANTITATIVE, PREGNANCY: hCG, Beta Chain, Quant, S: 13582 m[IU]/mL — ABNORMAL HIGH (ref <5)

## 2014-11-01 LAB — CBC
HEMATOCRIT: 34.4 % — AB (ref 36.0–46.0)
HEMOGLOBIN: 11.4 g/dL — AB (ref 12.0–15.0)
MCH: 29.3 pg (ref 26.0–34.0)
MCHC: 33.1 g/dL (ref 30.0–36.0)
MCV: 88.4 fL (ref 78.0–100.0)
Platelets: 334 10*3/uL (ref 150–400)
RBC: 3.89 MIL/uL (ref 3.87–5.11)
RDW: 15.8 % — ABNORMAL HIGH (ref 11.5–15.5)
WBC: 9.7 10*3/uL (ref 4.0–10.5)

## 2014-11-01 MED ORDER — AZITHROMYCIN 250 MG PO TABS
500.0000 mg | ORAL_TABLET | Freq: Once | ORAL | Status: AC
  Administered 2014-11-01: 500 mg via ORAL
  Filled 2014-11-01: qty 2

## 2014-11-01 MED ORDER — MISOPROSTOL 200 MCG PO TABS
800.0000 ug | ORAL_TABLET | Freq: Once | ORAL | Status: AC
  Administered 2014-11-01: 800 ug via RECTAL
  Filled 2014-11-01: qty 4

## 2014-11-01 MED ORDER — ONDANSETRON HCL 4 MG PO TABS: 4.0000 mg | ORAL_TABLET | Freq: Three times a day (TID) | ORAL | Status: DC | PRN

## 2014-11-01 MED ORDER — PRENATAL MULTIVITAMIN CH: 1.0000 | ORAL_TABLET | Freq: Every day | ORAL | Status: DC

## 2014-11-01 MED ORDER — PROMETHAZINE HCL 25 MG PO TABS: 25.0000 mg | ORAL_TABLET | Freq: Four times a day (QID) | ORAL | Status: AC | PRN

## 2014-11-01 MED ORDER — HYDROMORPHONE HCL 1 MG/ML IJ SOLN
2.0000 mg | INTRAMUSCULAR | Status: DC | PRN
Start: 2014-11-01 — End: 2014-11-01
  Administered 2014-11-01: 2 mg via INTRAVENOUS
  Filled 2014-11-01: qty 2

## 2014-11-01 MED ORDER — LACTATED RINGERS IV SOLN
INTRAVENOUS | Status: DC
  Administered 2014-11-01: 15:00:00 via INTRAVENOUS

## 2014-11-01 MED ORDER — ONDANSETRON HCL 4 MG/2ML IJ SOLN: 4.0000 mg | Freq: Four times a day (QID) | INTRAMUSCULAR | Status: DC | PRN

## 2014-11-01 MED ORDER — HYDROMORPHONE HCL 1 MG/ML IJ SOLN
2.0000 mg | INTRAMUSCULAR | Status: DC | PRN
  Administered 2014-11-01: 2 mg via INTRAVENOUS
  Filled 2014-11-01: qty 2

## 2014-11-01 MED ORDER — ONDANSETRON HCL 4 MG/2ML IJ SOLN
4.0000 mg | Freq: Once | INTRAMUSCULAR | Status: AC
  Administered 2014-11-01: 4 mg via INTRAVENOUS
  Filled 2014-11-01: qty 2

## 2014-11-01 MED ORDER — KETOROLAC TROMETHAMINE 30 MG/ML IJ SOLN
30.0000 mg | Freq: Once | INTRAMUSCULAR | Status: AC
  Administered 2014-11-01: 30 mg via INTRAVENOUS
  Filled 2014-11-01: qty 1

## 2014-11-01 MED ORDER — HYDROMORPHONE HCL 2 MG/ML IJ SOLN: 2.0000 mg | INTRAMUSCULAR | Status: DC | PRN

## 2014-11-01 MED ORDER — DEXTROSE 5 % IV SOLN
500.0000 mg | INTRAVENOUS | Status: DC
  Administered 2014-11-01: 500 mg via INTRAVENOUS
  Filled 2014-11-01: qty 500

## 2014-11-01 MED ORDER — OXYCODONE-ACETAMINOPHEN 5-325 MG PO TABS: 1.0000 | ORAL_TABLET | ORAL | Status: DC | PRN

## 2014-11-01 MED ORDER — LACTATED RINGERS IV BOLUS (SEPSIS)
500.0000 mL | Freq: Once | INTRAVENOUS | Status: AC
  Administered 2014-11-01: 500 mL via INTRAVENOUS

## 2014-11-01 MED ORDER — LACTATED RINGERS IV SOLN: INTRAVENOUS | Status: DC

## 2014-11-01 MED ORDER — OXYCODONE-ACETAMINOPHEN 5-325 MG PO TABS: 1.0000 | ORAL_TABLET | ORAL | Status: AC | PRN

## 2014-11-01 NOTE — Progress Notes (Signed)
Pt educated on d/c instructions and prescriptions. Verbalized understanding. [facility] will call pt on Monday 11-30 for f/u appt. Pt d/c home with family, stable via wc to private car.

## 2014-11-01 NOTE — Discharge Instructions (Signed)
Miscarriage °A miscarriage is the loss of an unborn baby (fetus) before the 20th week of pregnancy. The cause is often unknown.  °HOME CARE °· You may need to stay in bed (bed rest), or you may be able to do light activity. Go about activity as told by your doctor. °· Have help at home. °· Write down how many pads you use each day. Write down how soaked they are. °· Do not use tampons. Do not wash out your vagina (douche) or have sex (intercourse) until your doctor approves. °· Only take medicine as told by your doctor. °· Do not take aspirin. °· Keep all doctor visits as told. °· If you or your partner have problems with grieving, talk to your doctor. You can also try counseling. Give yourself time to grieve before trying to get pregnant again. °GET HELP RIGHT AWAY IF: °· You have bad cramps or pain in your back or belly (abdomen). °· You have a fever. °· You pass large clumps of blood (clots) from your vagina that are walnut-sized or larger. Save the clumps for your doctor to see. °· You pass large amounts of tissue from your vagina. Save the tissue for your doctor to see. °· You have more bleeding. °· You have thick, bad-smelling fluid (discharge) coming from the vagina. °· You get lightheaded, weak, or you pass out (faint). °· You have chills. °MAKE SURE YOU: °· Understand these instructions. °· Will watch your condition. °· Will get help right away if you are not doing well or get worse. °Document Released: 02/13/2012 Document Reviewed: 02/13/2012 °ExitCare® Patient Information ©2015 [employer]. This information is not intended to replace advice given to you by your health care provider. Make sure you discuss any questions you have with your health care provider. ° °

## 2014-11-01 NOTE — MAU Note (Addendum)
Known miscarriage, started having lower left abdominal pain and some SOB.  Had some spotting on Thursday then it went away and started back up yesterday and went away again.  Pain has gotten worse and is constant now. Bleeding has increased today and she has been passing some clots as well.

## 2014-11-01 NOTE — Progress Notes (Signed)
Patient ambulated to bathroom without difficulty.  Quarter size blood clot(dark in appearance) noted in urine.  Patient states abdominal cramping better.  Scant amount dark red bleeding on peri-pad.  Will continue to monitor.

## 2014-11-01 NOTE — MAU Provider Note (Signed)
History   28 yo G1P0 with known blighted ovum at 76 6/7 week size of IUGS presents unannounced c/o severe LLQ pain since last night.  Dx with confirmed blighted ovum 10/22/14 at MAU.  Had f/u visit at [facility] on 11/24 with Dr. Alesia Richards.  Patient elected observation as plan of care, with plan for f/u in 3 weeks.  Rx'd with Percocet and Azithromycin, but patient has not filled these Rxs.  Describes pain and spotting beginning Thursday, resolved, then came back yesterday with increased intensity.  Described passing some clots.  Has had vomiting today.  Reports pain makes her short of breath, but no chest pain, cough, or any other cardiac or respiratory sx.  Initially seen at [facility] on 10/09/14, with possible blighted ovum on [place].  Trich noted on wet prep and treated with MTZ 2 gm dose then.  Chlamydia on culture from that visit--ER attempted to contact patient, but unsuccessful.  B+ type.  Hgb 11.2 10/22/14.  QHCG results: 7322 on 10/09/14 [facility]) 02542 on 10/22/14 (MAU) 70623 on 10/28/14 ([facility])  Patient Active Problem List   Diagnosis Date Noted  . Vaginal bleeding 11/01/2014    Chief Complaint  Patient presents with  . Abdominal Pain  . Miscarriage   HPI: See above  OB History    Gravida Para Term Preterm AB TAB SAB Ectopic Multiple Living   1               History reviewed. No pertinent past medical history.  Past Surgical History  Procedure Laterality Date  . External ear surgery      History reviewed. No pertinent family history.  History  Substance Use Topics  . Smoking status: Current Every Day Smoker    Types: Cigarettes  . Smokeless tobacco: Not on file  . Alcohol Use: Yes    Allergies: No Known Allergies  Prescriptions prior to admission  Medication Sig Dispense Refill Last Dose  . acetaminophen (TYLENOL) 500 MG tablet Take 1,000 mg by mouth every 6 (six) hours as needed for moderate pain.   11/01/2014 at Unknown time  . diphenhydrAMINE (BENADRYL) 25 MG tablet Take 1  tablet (25 mg total) by mouth every 6 (six) hours as needed for itching (Rash). (Patient not taking: Reported on 11/01/2014) 30 tablet 0   . oxyCODONE-acetaminophen (ROXICET) 5-325 MG per tablet Take 1-2 tablets by mouth every 4 (four) hours as needed for severe pain. (Patient not taking: Reported on 11/01/2014) 15 tablet 0   . predniSONE (DELTASONE) 20 MG tablet Take 2 tablets (40 mg total) by mouth daily. Take 40 mg by mouth daily for 3 days, then 20mg  by mouth daily for 3 days, then 10mg  daily for 3 days (Patient not taking: Reported on 11/01/2014) 12 tablet 0   . Prenatal Vit-Fe Fumarate-FA (PRENATAL VITAMINS) 28-0.8 MG TABS Take 1 tablet by mouth daily. (Patient not taking: Reported on 11/01/2014) 30 tablet 0     ROS:  Cramping, spotting, N/V Physical Exam   Blood pressure 110/70, pulse 70, temperature 98.3 F (36.8 C), temperature source Oral, resp. rate 18, height 5\' 5"  (1.651 m), weight 154 lb 9.6 oz (70.126 kg), last menstrual period 09/25/2014.  Physical Exam  In moderate distress with pelvic pain, more on left than right. Chest clear Heart RRR without murmur Abd soft, tender to palpation, left > right, no rebound or guarding Pelvic--cervix closed, small amount dark spotting in vault. Ext WNL   ED Course  Assessment: Known blighted ovum Pelvic pain Chlamydia sx  10/09/14, not yet treated.  Plan: IV hydration Zofran CBC, QHCG Toradol Transvaginal US when patient more comfortable after pain med.   Donnel Saxon CNM, MSN 11/01/2014 10:03 AM  Addendum: Just returned from US--Toradol held her through [place], now requesting additional pain med.  [place]:  GS in vagina, just past the cervix. MSD 1.48, 6 2/7 week size. Large clot in uterus, endometrium 2.6 cm.  No definitive YS or FP.  Small posterior fibroid 1.6 x 1.4 x 1.6  Results for orders placed or performed during the hospital encounter of 11/01/14 (from the past 24 hour(s))  Urinalysis, Routine w reflex microscopic      Status: Abnormal   Collection Time: 11/01/14  9:33 AM  Result Value Ref Range   Color, Urine YELLOW YELLOW   APPearance CLEAR CLEAR   Specific Gravity, Urine 1.020 1.005 - 1.030   pH 7.0 5.0 - 8.0   Glucose, UA NEGATIVE NEGATIVE mg/dL   Hgb urine dipstick LARGE (A) NEGATIVE   Bilirubin Urine NEGATIVE NEGATIVE   Ketones, ur NEGATIVE NEGATIVE mg/dL   Protein, ur NEGATIVE NEGATIVE mg/dL   Urobilinogen, UA 0.2 0.0 - 1.0 mg/dL   Nitrite NEGATIVE NEGATIVE   Leukocytes, UA NEGATIVE NEGATIVE  Urine microscopic-add on     Status: Abnormal   Collection Time: 11/01/14  9:33 AM  Result Value Ref Range   Squamous Epithelial / LPF RARE RARE   WBC, UA 3-6 <3 WBC/hpf   RBC / HPF TOO NUMEROUS TO COUNT <3 RBC/hpf   Bacteria, UA FEW (A) RARE   Urine-Other MUCOUS PRESENT   CBC     Status: Abnormal   Collection Time: 11/01/14 10:14 AM  Result Value Ref Range   WBC 9.7 4.0 - 10.5 K/uL   RBC 3.89 3.87 - 5.11 MIL/uL   Hemoglobin 11.4 (L) 12.0 - 15.0 g/dL   HCT 34.4 (L) 36.0 - 46.0 %   MCV 88.4 78.0 - 100.0 fL   MCH 29.3 26.0 - 34.0 pg   MCHC 33.1 30.0 - 36.0 g/dL   RDW 15.8 (H) 11.5 - 15.5 %   Platelets 334 150 - 400 K/uL  hCG, quantitative, pregnancy     Status: Abnormal   Collection Time: 11/01/14 10:14 AM  Result Value Ref Range   hCG, Beta Chain, Quant, S 13582 (H) <5 mIU/mL   Consulted with Dr. Simona Huh. Recommended Cytotech 800 mcg now, with observation this afternoon. Pain med as needed. Azithromycin IV for chlamydia treatment--per consult with pharmacy, will give Azithromycin 500 mg IV now, due to no information regarding 1 gm IV Azithromycin dose.  If d/c'd home, will give additional 500 mg Azithromycin dose. Patient agreeable with plan. No beds available on 3rd floor due to staffing--will keep in MAU at present.  Donnel Saxon, CNM 11/01/14 12:10p  Addendum: Increased cramping. Repeat pelvic:  Likely POC in cervix, removed and sent to path. Small amount BRB noted. VSS. Cytotech  800 mcg placed in rectum Will monitor VS, pain status, bleeding.   If bleeding and pain are stable after 4 hour observation, will likely d/c home.  Dr. Simona Huh updated after 2nd pelvic exam.  Donnel Saxon, CNM 11/01/14 1:10p  Addendum: Patient able to be transferred to 3rd floor for further observation time. Currently sleeping. Received Dilaudid at 1538 with benefit.  Filed Vitals:   11/01/14 1434  BP: 102/58  Pulse: 61  Temp: 98.9 F (37.2 C)  Resp: 17   Received Azithromycin 500 mg at 1534.  Will observe until 6p, then  re-evaluate.  Donnel Saxon, CNM 11/01/14 4:30p  Addendum: Patient doing well--minimal bleeding, mild cramping. Hungry. Filed Vitals:   11/01/14 1731  BP: 106/74  Pulse: 67  Temp: 98 F (36.7 C)  Resp: 20   Will allow regular diet. Azithromycin 500 mg po dose now Percocet 1 po now. D/C home with bleeding precautions. Rx Percocet and Phenergan. Plan f/u in office next week--office will call patient on Monday to schedule f/u. Support to patient for loss. Note for work on Monday, 11/30--patient will call if needs additional days.  Donnel Saxon, CNM 11/01/14 7:15p

## 2014-11-01 NOTE — H&P (Signed)
28 yo G1P0 with known blighted ovum at 3 6/7 week size of IUGS presents unannounced c/o severe LLQ pain since last night.  Dx with confirmed blighted ovum 10/22/14 at MAU.  Had f/u visit at [facility] on 11/24 with Dr. Alesia Richards.  Patient elected observation as plan of care, with plan for f/u in 3 weeks.  Rx'd with Percocet and Azithromycin, but patient has not filled these Rxs.  Describes pain and spotting beginning Thursday, resolved, then came back yesterday with increased intensity.  Described passing some clots.  Has had vomiting today.  Reports pain makes her short of breath, but no chest pain, cough, or any other cardiac or respiratory sx.  Initially seen at [facility] on 10/09/14, with possible blighted ovum on [place].  Trich noted on wet prep and treated with MTZ 2 gm dose then.  Chlamydia on culture from that visit--ER attempted to contact patient, but unsuccessful.  B+ type.  Hgb 11.2 10/22/14.  QHCG results: 3903 on 10/09/14 [facility]) 00923 on 10/22/14 (MAU) 30076 on 10/28/14 ([facility])  Patient Active Problem List   Diagnosis Date Noted  . Vaginal bleeding 11/01/2014    Chief Complaint  Patient presents with  . Abdominal Pain  . Miscarriage   HPI: See above  OB History    Gravida Para Term Preterm AB TAB SAB Ectopic Multiple Living   1               History reviewed. No pertinent past medical history.  Past Surgical History  Procedure Laterality Date  . External ear surgery      History reviewed. No pertinent family history.  History  Substance Use Topics  . Smoking status: Current Every Day Smoker    Types: Cigarettes  . Smokeless tobacco: Not on file  . Alcohol Use: Yes    Allergies: No Known Allergies  Prescriptions prior to admission  Medication Sig Dispense Refill Last Dose  . acetaminophen (TYLENOL) 500 MG tablet Take 1,000 mg by mouth every 6 (six) hours as needed for moderate pain.   11/01/2014 at Unknown time  . diphenhydrAMINE (BENADRYL) 25 MG tablet Take 1 tablet (25 mg  total) by mouth every 6 (six) hours as needed for itching (Rash). (Patient not taking: Reported on 11/01/2014) 30 tablet 0   . oxyCODONE-acetaminophen (ROXICET) 5-325 MG per tablet Take 1-2 tablets by mouth every 4 (four) hours as needed for severe pain. (Patient not taking: Reported on 11/01/2014) 15 tablet 0   . predniSONE (DELTASONE) 20 MG tablet Take 2 tablets (40 mg total) by mouth daily. Take 40 mg by mouth daily for 3 days, then 20mg  by mouth daily for 3 days, then 10mg  daily for 3 days (Patient not taking: Reported on 11/01/2014) 12 tablet 0   . Prenatal Vit-Fe Fumarate-FA (PRENATAL VITAMINS) 28-0.8 MG TABS Take 1 tablet by mouth daily. (Patient not taking: Reported on 11/01/2014) 30 tablet 0     ROS:  Cramping, spotting, N/V Physical Exam   Blood pressure 110/70, pulse 70, temperature 98.3 F (36.8 C), temperature source Oral, resp. rate 18, height 5\' 5"  (1.651 m), weight 154 lb 9.6 oz (70.126 kg), last menstrual period 09/25/2014.  Physical Exam  In moderate distress with pelvic pain, more on left than right. Chest clear Heart RRR without murmur Abd soft, tender to palpation, left > right, no rebound or guarding Pelvic--cervix closed, small amount dark spotting in vault. Ext WNL   ED Course  Assessment: Known blighted ovum Pelvic pain Chlamydia sx 10/09/14, not yet  treated.  Plan: IV hydration Zofran CBC, QHCG Toradol Transvaginal US when patient more comfortable after pain med.   Donnel Saxon CNM, MSN 11/01/2014 10:03 AM  Addendum: Just returned from US--Toradol held her through [place], now requesting additional pain med.  [place]:  GS in vagina, just past the cervix. MSD 1.48, 6 2/7 week size. Large clot in uterus, endometrium 2.6 cm.  No definitive YS or FP.  Small posterior fibroid 1.6 x 1.4 x 1.6  Results for orders placed or performed during the hospital encounter of 11/01/14 (from the past 24 hour(s))  Urinalysis, Routine w reflex microscopic     Status: Abnormal    Collection Time: 11/01/14  9:33 AM  Result Value Ref Range   Color, Urine YELLOW YELLOW   APPearance CLEAR CLEAR   Specific Gravity, Urine 1.020 1.005 - 1.030   pH 7.0 5.0 - 8.0   Glucose, UA NEGATIVE NEGATIVE mg/dL   Hgb urine dipstick LARGE (A) NEGATIVE   Bilirubin Urine NEGATIVE NEGATIVE   Ketones, ur NEGATIVE NEGATIVE mg/dL   Protein, ur NEGATIVE NEGATIVE mg/dL   Urobilinogen, UA 0.2 0.0 - 1.0 mg/dL   Nitrite NEGATIVE NEGATIVE   Leukocytes, UA NEGATIVE NEGATIVE  Urine microscopic-add on     Status: Abnormal   Collection Time: 11/01/14  9:33 AM  Result Value Ref Range   Squamous Epithelial / LPF RARE RARE   WBC, UA 3-6 <3 WBC/hpf   RBC / HPF TOO NUMEROUS TO COUNT <3 RBC/hpf   Bacteria, UA FEW (A) RARE   Urine-Other MUCOUS PRESENT   CBC     Status: Abnormal   Collection Time: 11/01/14 10:14 AM  Result Value Ref Range   WBC 9.7 4.0 - 10.5 K/uL   RBC 3.89 3.87 - 5.11 MIL/uL   Hemoglobin 11.4 (L) 12.0 - 15.0 g/dL   HCT 34.4 (L) 36.0 - 46.0 %   MCV 88.4 78.0 - 100.0 fL   MCH 29.3 26.0 - 34.0 pg   MCHC 33.1 30.0 - 36.0 g/dL   RDW 15.8 (H) 11.5 - 15.5 %   Platelets 334 150 - 400 K/uL  hCG, quantitative, pregnancy     Status: Abnormal   Collection Time: 11/01/14 10:14 AM  Result Value Ref Range   hCG, Beta Chain, Quant, S 13582 (H) <5 mIU/mL   Consulted with Dr. Simona Huh. Recommended Cytotech 800 mcg now, with observation this afternoon. Pain med as needed. Azithromycin IV for chlamydia treatment--per consult with pharmacy, will give Azithromycin 500 mg IV now, due to no information regarding 1 gm IV Azithromycin dose.  If d/c'd home, will give additional 500 mg Azithromycin dose. Patient agreeable with plan. No beds available on 3rd floor due to staffing--will keep in MAU at present, and admit for observation when bed available.  Donnel Saxon, CNM 11/01/14 12:10p

## 2014-11-02 NOTE — Discharge Summary (Signed)
Physician Discharge Summary  Patient ID: Janet Nixon MRN: [id number] DOB/AGE: 02-28-1986 28 y.o.  Admit date: 11/01/2014 Discharge date: 11/02/2014  Admission Diagnoses:  SAB at 6 weeks  Discharge Diagnoses:  Active Problems:   Vaginal bleeding   SAB (spontaneous abortion)--10/24/14   Smoker   Chlamydia--dx 10/09/14   Fibroid--approx 1 cm.   Discharged Condition: stable  Hospital Course: Presented to MAU on 11/28 with increased pain from known blighted ovum dx 10/18/14 (initially suspected 11/5, then confirmed in MAU with f/u US).  Likely GS removed from cervical os in MAU, with [employer] 800 mcg placed per rectum, but patient still had significant pain and was admitted to 3rd floor for further observation, IV hydration, antiemetics, and IV pain med.  CBC was stable, QHCG had dropped almost 50% from previous level (13000 from 32000).  Patient given total of 1 gm Azithromycin for treatment of previously dx chlamydia.  After several hours of observation, patient was noted to have minimal bleeding, only mild cramping, and stable VS.  She was d/c'd home, with plan for f/u in the office in approx 1 week for recheck of [facility] and status.  Rx Phenergan and Percocet.  Note given for patient to be OOW on Monday.  Consults: Dr. Simona Huh.  Significant Diagnostic Studies:  Results for orders placed or performed during the hospital encounter of 11/01/14 (from the past 72 hour(s))  Urinalysis, Routine w reflex microscopic     Status: Abnormal   Collection Time: 11/01/14  9:33 AM  Result Value Ref Range   Color, Urine YELLOW YELLOW   APPearance CLEAR CLEAR   Specific Gravity, Urine 1.020 1.005 - 1.030   pH 7.0 5.0 - 8.0   Glucose, UA NEGATIVE NEGATIVE mg/dL   Hgb urine dipstick LARGE (A) NEGATIVE   Bilirubin Urine NEGATIVE NEGATIVE   Ketones, ur NEGATIVE NEGATIVE mg/dL   Protein, ur NEGATIVE NEGATIVE mg/dL   Urobilinogen, UA 0.2 0.0 - 1.0 mg/dL   Nitrite NEGATIVE NEGATIVE   Leukocytes, UA  NEGATIVE NEGATIVE  Urine microscopic-add on     Status: Abnormal   Collection Time: 11/01/14  9:33 AM  Result Value Ref Range   Squamous Epithelial / LPF RARE RARE   WBC, UA 3-6 <3 WBC/hpf   RBC / HPF TOO NUMEROUS TO COUNT <3 RBC/hpf   Bacteria, UA FEW (A) RARE   Urine-Other MUCOUS PRESENT   CBC     Status: Abnormal   Collection Time: 11/01/14 10:14 AM  Result Value Ref Range   WBC 9.7 4.0 - 10.5 K/uL   RBC 3.89 3.87 - 5.11 MIL/uL   Hemoglobin 11.4 (L) 12.0 - 15.0 g/dL   HCT 34.4 (L) 36.0 - 46.0 %   MCV 88.4 78.0 - 100.0 fL   MCH 29.3 26.0 - 34.0 pg   MCHC 33.1 30.0 - 36.0 g/dL   RDW 15.8 (H) 11.5 - 15.5 %   Platelets 334 150 - 400 K/uL  hCG, quantitative, pregnancy     Status: Abnormal   Collection Time: 11/01/14 10:14 AM  Result Value Ref Range   hCG, Beta Chain, Quant, S 13582 (H) <5 mIU/mL    Comment:          GEST. AGE      CONC.  (mIU/mL)   <=1 WEEK        5 - 50     2 WEEKS       50 - 500     3 WEEKS  100 - 10,000     4 WEEKS     1,000 - 30,000     5 WEEKS     3,500 - 115,000   6-8 WEEKS     12,000 - 270,000    12 WEEKS     15,000 - 220,000        FEMALE AND NON-PREGNANT FEMALE:     LESS THAN 5 mIU/mL     and radiology: [place]  Treatments: IV hydration and analgesia: Dilaudid  Discharge Exam: Blood pressure 106/74, pulse 67, temperature 98 F (36.7 C), temperature source Oral, resp. rate 20, height 5\' 5"  (1.651 m), weight 154 lb 9.6 oz (70.126 kg), last menstrual period 09/25/2014, SpO2 100 %. General appearance: alert Cardio: regular rate and rhythm, S1, S2 normal, no murmur, click, rub or gallop Pelvic: cervix normal in appearance, external genitalia normal, no adnexal masses or tenderness, no cervical motion tenderness, rectovaginal septum normal, uterus normal size, shape, and consistency and vagina normal without discharge Extremities: extremities normal, atraumatic, no cyanosis or edema  Minimal bleeding on pelvic exam, cervix closed after removal of  POC.  POC sent to pathology  Disposition: 01-Home or Self Care     Medication List    STOP taking these medications        acetaminophen 500 MG tablet  Commonly known as:  TYLENOL     diphenhydrAMINE 25 MG tablet  Commonly known as:  BENADRYL     predniSONE 20 MG tablet  Commonly known as:  DELTASONE     Prenatal Vitamins 28-0.8 MG Tabs      TAKE these medications        oxyCODONE-acetaminophen 5-325 MG per tablet  Commonly known as:  PERCOCET/ROXICET  Take 1 tablet by mouth every 3 (three) hours as needed for moderate pain.     promethazine 25 MG tablet  Commonly known as:  PHENERGAN  Take 1 tablet (25 mg total) by mouth every 6 (six) hours as needed for nausea or vomiting.           Follow-up Information    Follow up with [facility] In 1 week.   Specialty:  Obstetrics and Gynecology   Why:  Office will call you on Monday to schedule follow-up at the office later this week.  Call for any questions or concerns.   Contact information:   [address] [place] [address] [phone]      Signed: Donnel Saxon 11/01/14 8:30p

## 2014-12-10 IMAGING — CT CT ABD-PELV W/ CM
1 of 2 series · 15 of 32 positions shown, 19 images · IV contrast (OMNIPAQUE 300)
Comparison: None.

CLINICAL DATA: Right lower quadrant pain

EXAM:
CT ABDOMEN AND PELVIS WITH CONTRAST
TECHNIQUE: Multidetector CT imaging of the abdomen and pelvis was performed
using the standard protocol following bolus administration of
intravenous contrast.
CONTRAST:  100mL OMNIPAQUE IOHEXOL 300 MG/ML  SOLN

[Series 2: abd/pel with · axial · 0.65mm/px · z∈[-409,-34]mm · 15 of 83 slices shown, 19 images]
[im 4/83  soft-tissue]
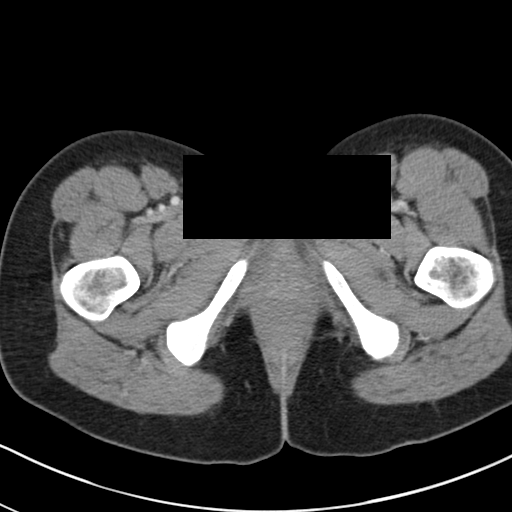
[im 4/83  bone]
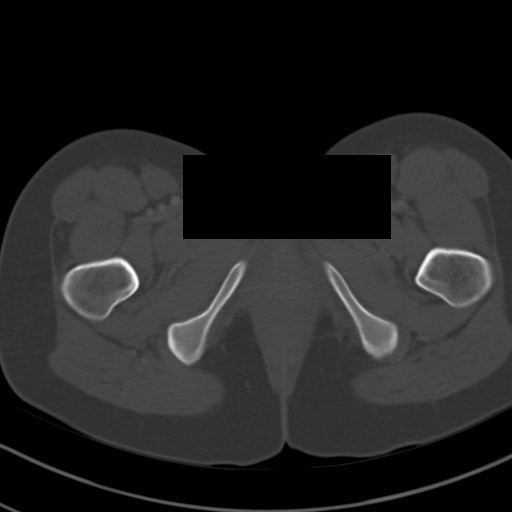
[im 10/83  soft-tissue]
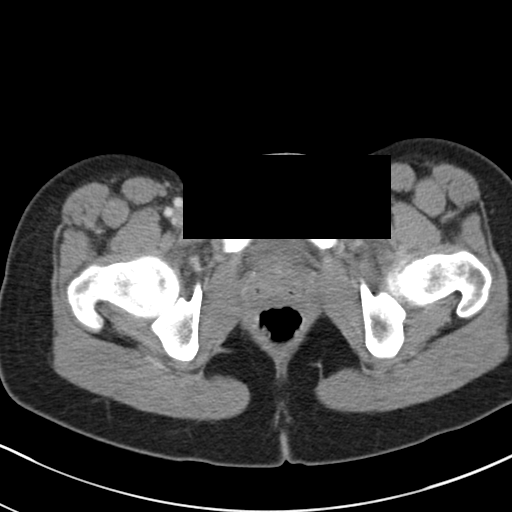
[im 16/83  soft-tissue]
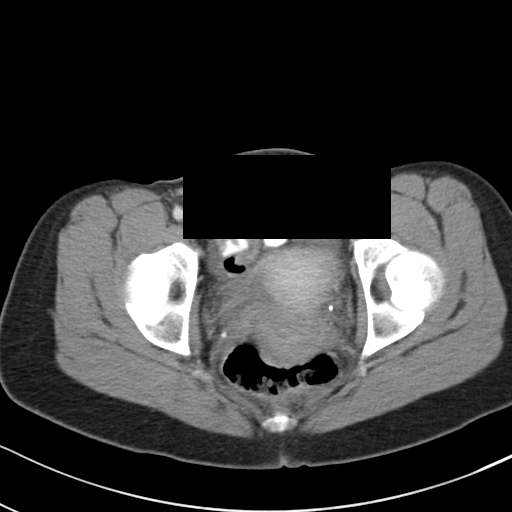
[im 23/83  soft-tissue]
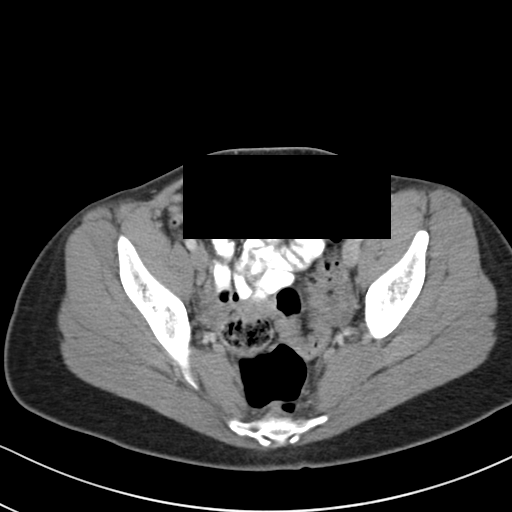
[im 29/83  soft-tissue]
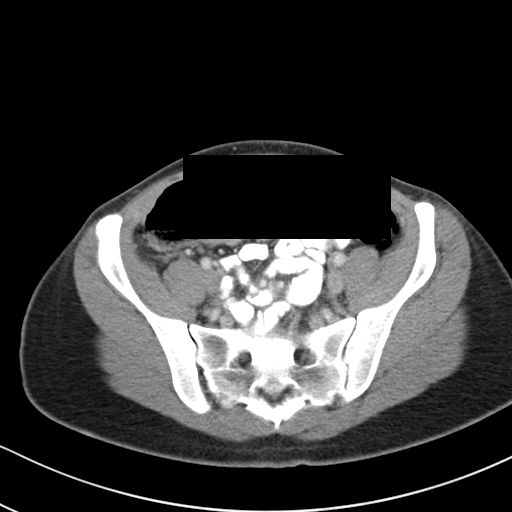
[im 35/83  soft-tissue]
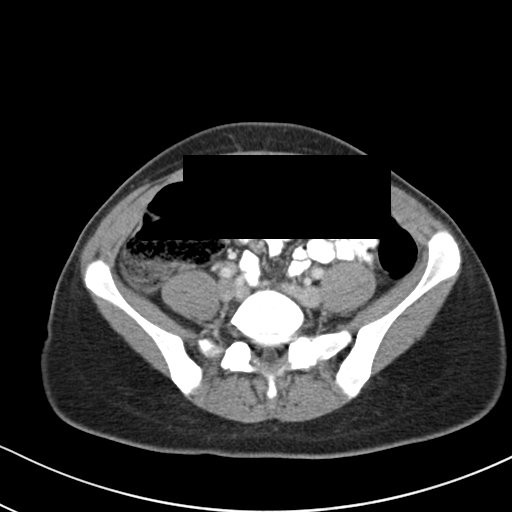
[im 42/83  soft-tissue]
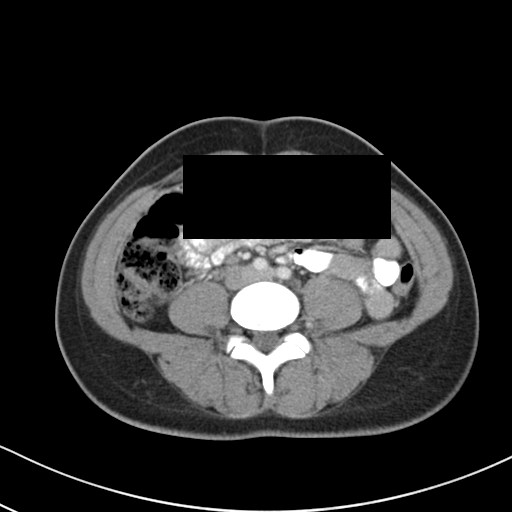
[im 48/83  soft-tissue]
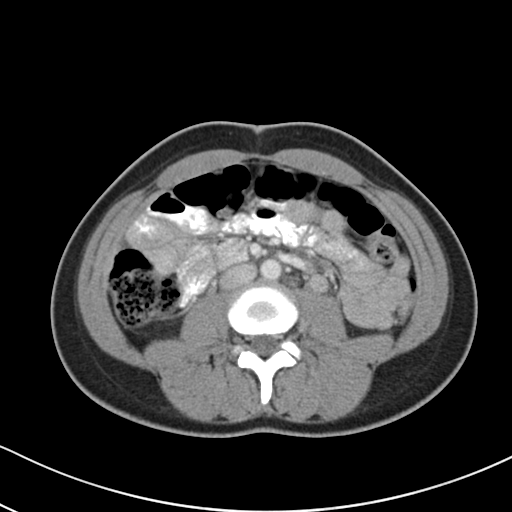
[im 54/83  soft-tissue]
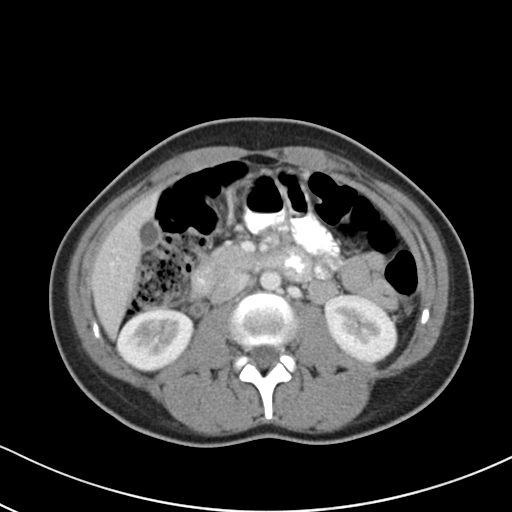
[im 54/83  bone]
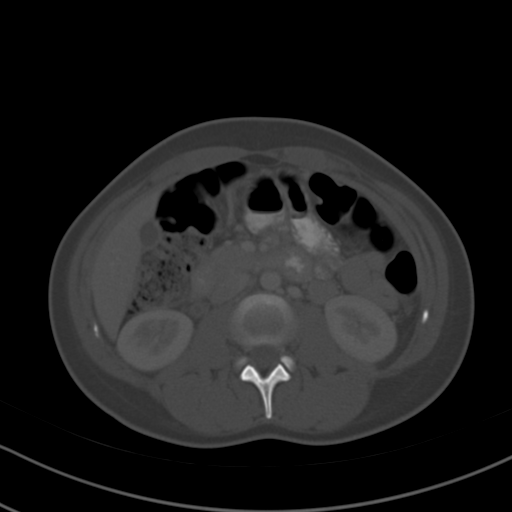
[im 60/83  soft-tissue]
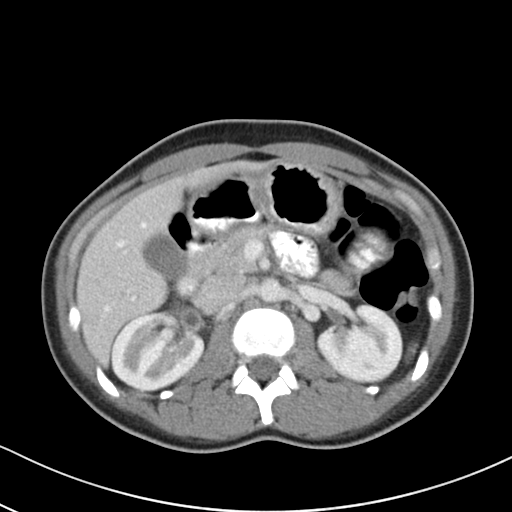
[im 67/83  soft-tissue]
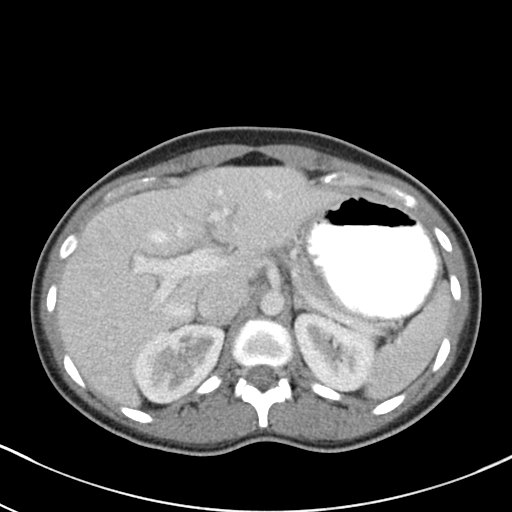
[im 70/83  lung]
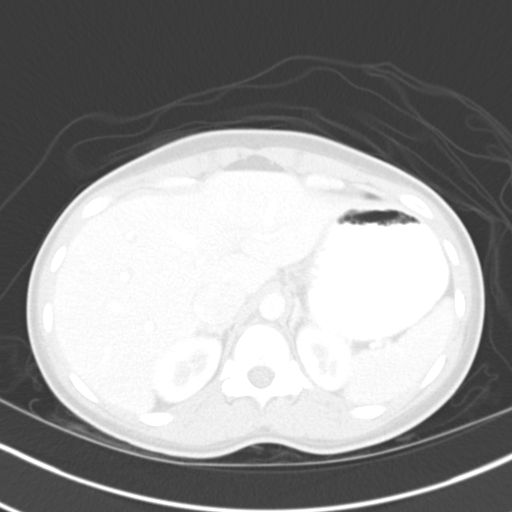
[im 73/83  soft-tissue]
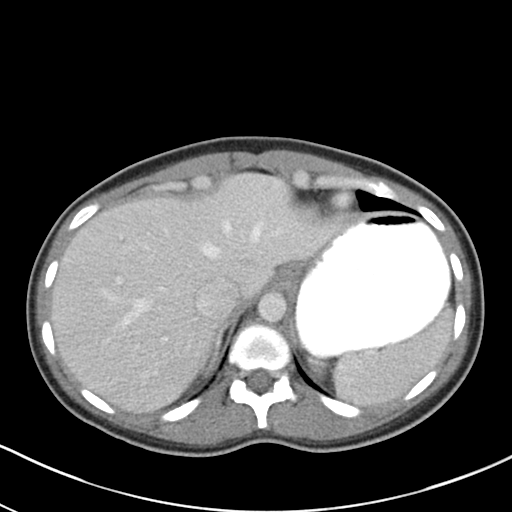
[im 73/83  lung]
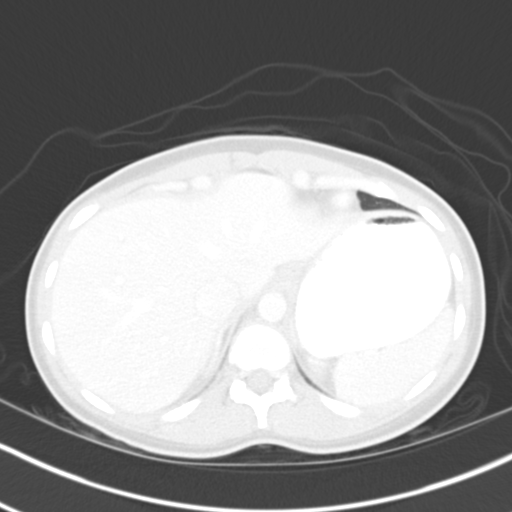
[im 76/83  lung]
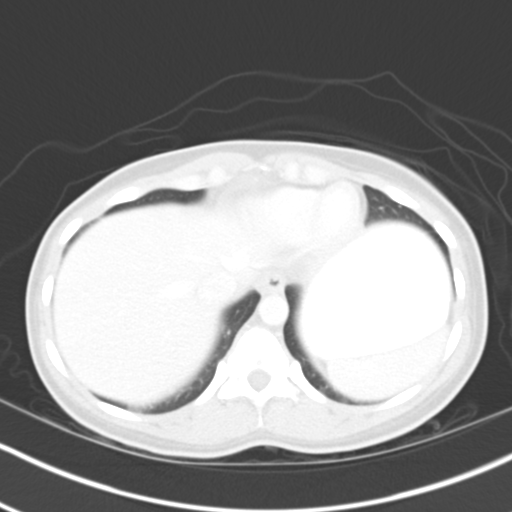
[im 79/83  soft-tissue]
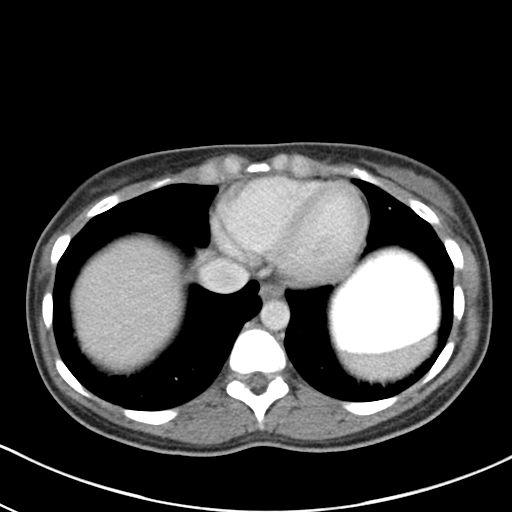
[im 79/83  lung]
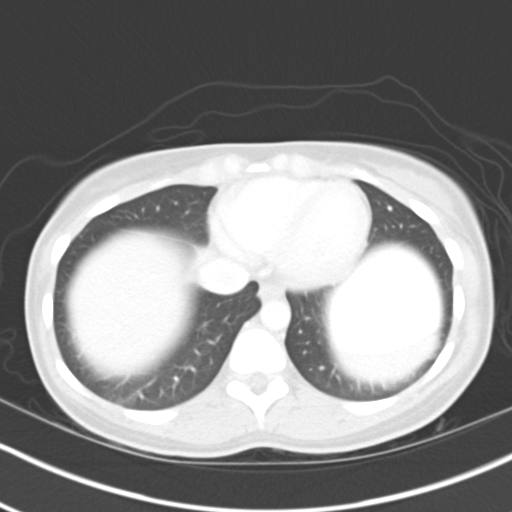

[15 of 32 positions shown; findings below may reference images not displayed]

FINDINGS: Visualized lung bases are clear.

The liver demonstrates a normal contrast enhanced appearance. Mild
periportal edema noted. Gallbladder within normal limits. No biliary
ductal dilatation. The spleen, adrenal glands, and pancreas
demonstrate a normal contrast enhanced appearance.

1.2 cm dystrophic calcification within the right renal parenchyma
noted, which may represent sequelae of remote infection, trauma, or
possibly a calcified involuted cyst. There is circumferential
enhancement with mild wall thickening about the right renal pelvis
and proximal right ureter (series 2, image 26), suggestive of
possible urinary tract infection. No hydronephrosis. The right renal
nephrogram is within normal limits without frank evidence of
pyelonephritis.

No evidence of bowel obstruction. Appendix is visualized in the
right lower quadrant and is of normal caliber and appearance without
associated inflammatory changes to suggest acute appendicitis. No
abnormal wall thickening, mucosal enhancement, or inflammatory fat
stranding seen about the bowels.

Bladder is within normal limits. Uterus and ovaries are
unremarkable.

No free air or fluid. No enlarged intra-abdominal pelvic lymph
nodes. Normal intravascular enhancement seen within the
intra-abdominal aorta and its branch vessels. Circumaortic left
renal vein noted.

No acute osseous abnormality. No worrisome lytic or blastic osseous
lesions.
IMPRESSION: 1. Abnormal wall thickening and enhancement about the right renal
pelvis and right ureter, suspicious for right-sided upper urinary
tract infection. No overt CT evidence of pyelonephritis within the
right kidney itself.
2. No other acute intra-abdominal pelvic process.  Normal appendix.

## 2015-05-14 ENCOUNTER — Emergency Department: Admit: 2015-05-14

## 2015-05-14 ENCOUNTER — Inpatient Hospital Stay: Admit: 2015-05-14 | Discharge: 2015-05-14 | Disposition: A | Discharge status: Home / Self Care

## 2015-05-14 DIAGNOSIS — S62316A Displaced fracture of base of fifth metacarpal bone, right hand, initial encounter for closed fracture: Secondary | ICD-10-CM

## 2015-05-14 MED ORDER — acetaminophen (TYLENOL) tablet 975 mg
325 | Freq: Once | ORAL | Status: AC
Start: 2015-05-14 — End: 2015-05-14

## 2015-05-14 MED ORDER — acetaminophen (TYLENOL) tablet 975 mg
325 | Freq: Once | ORAL | Status: AC
Start: 2015-05-14 — End: 2015-05-14
  Administered 2015-05-14: 15:00:00 975 mg via ORAL

## 2015-05-14 MED ORDER — ibuprofen (ADVIL,MOTRIN) tablet 800 mg
400 | Freq: Once | ORAL | Status: AC
Start: 2015-05-14 — End: 2015-05-14
  Administered 2015-05-14: 15:00:00 800 mg via ORAL

## 2015-05-14 MED FILL — IBUPROFEN 400 MG TABLET: 400 400 MG | ORAL | Qty: 2

## 2015-05-14 MED FILL — TYLENOL 325 MG TABLET: 325 325 mg | ORAL | Qty: 3
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
## 2015-05-14 NOTE — Unmapped (Signed)
Date of Service: ??05/14/2015    The patient was seen and examined with the resident physician.  I have reviewed the notes, assessments, plan and/or procedures.  The case was discussed with the resident physician and I agree with their documentation.    Assessment reveals tenderness and swelling over the hand 4th and 5th metacarpals.  Distal neurovascular was intact.
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
## 2015-07-06 IMAGING — US US OB COMP LESS 14 WK
1 series · 13 of 28 positions shown · non-contrast
Comparison: None.

CLINICAL DATA: Pelvic pain during pregnancy. Spotting. Gestational
age by last menstrual period 6 weeks and 2 days.

EXAM:
OBSTETRIC <14 WK US AND TRANSVAGINAL OB US
TECHNIQUE: Both transabdominal and transvaginal ultrasound examinations were
performed for complete evaluation of the gestation as well as the
maternal uterus, adnexal regions, and pelvic cul-de-sac.
Transvaginal technique was performed to assess early pregnancy.

[Series 1: us ob comp less 14 wk · 0.20mm/px · 13 of 76 slices shown]
[im 3/76]
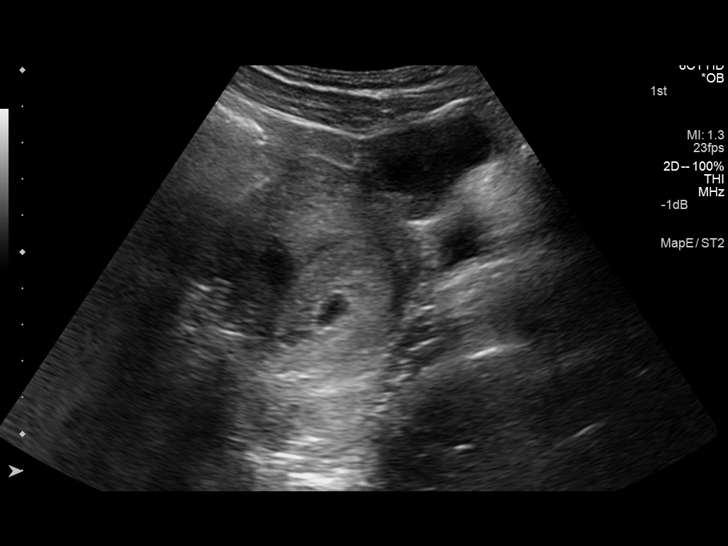
[im 9/76]
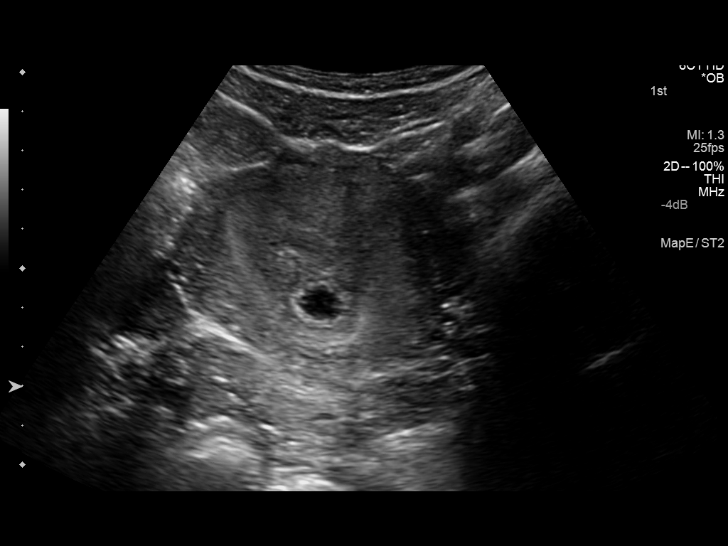
[im 14/76]
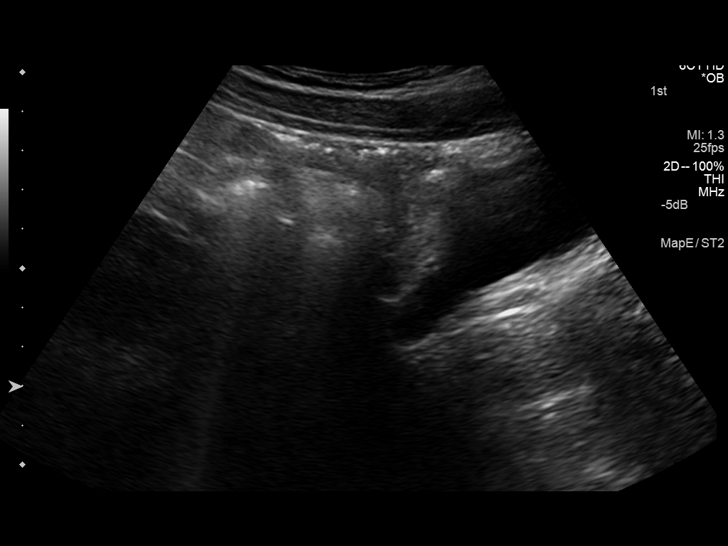
[im 20/76]
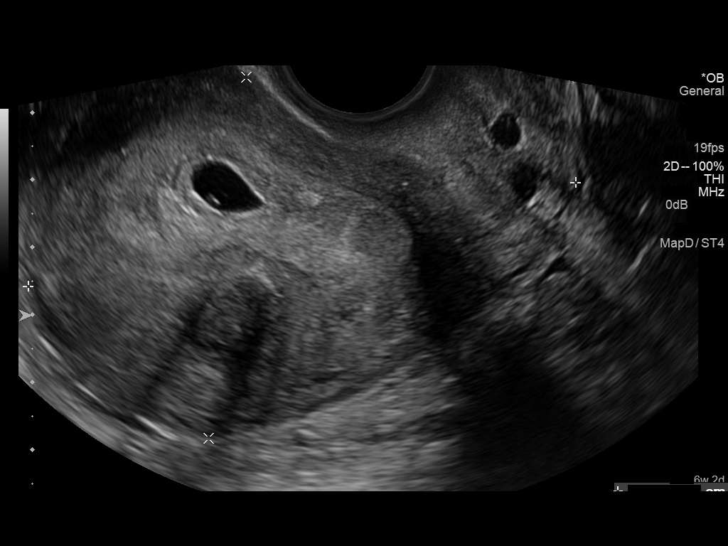
[im 26/76]
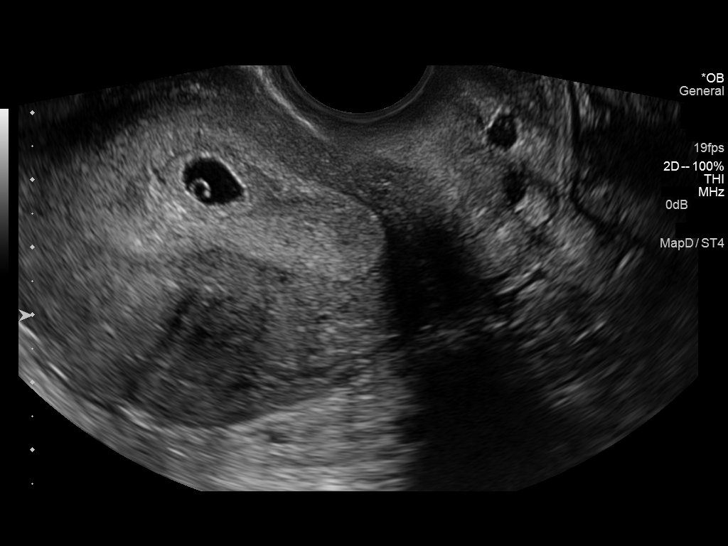
[im 31/76]
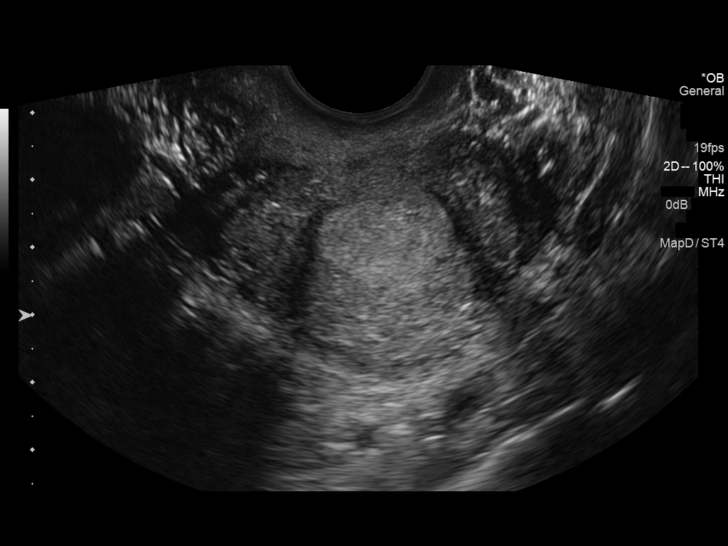
[im 39/76]
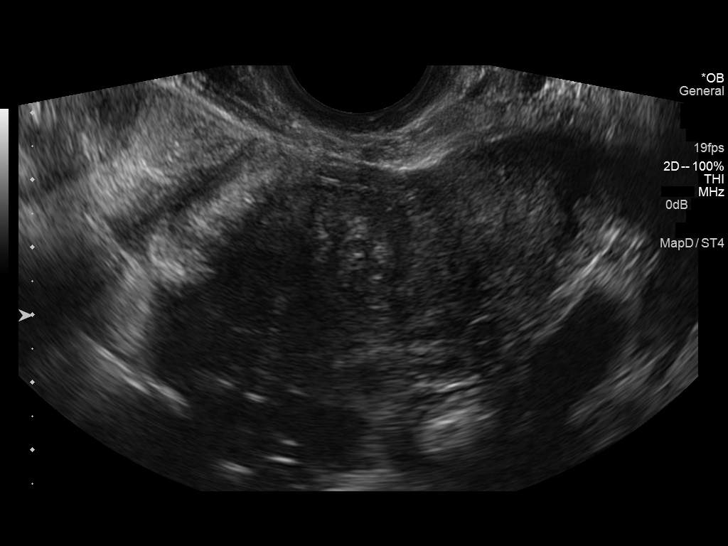
[im 45/76]
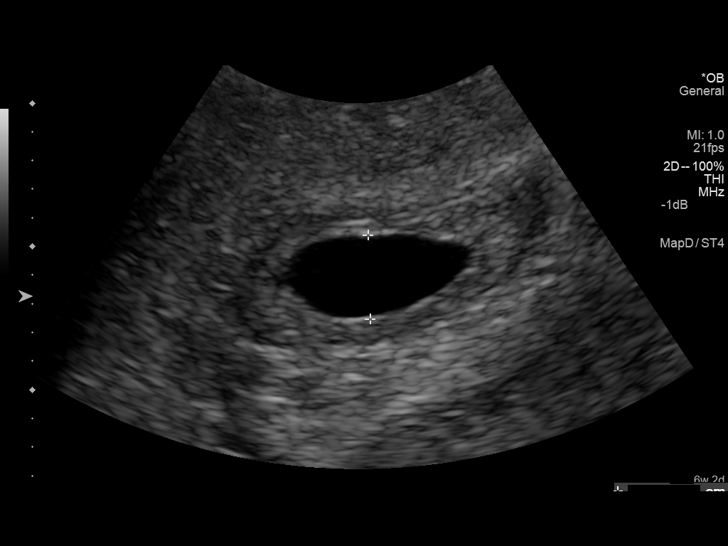
[im 51/76]
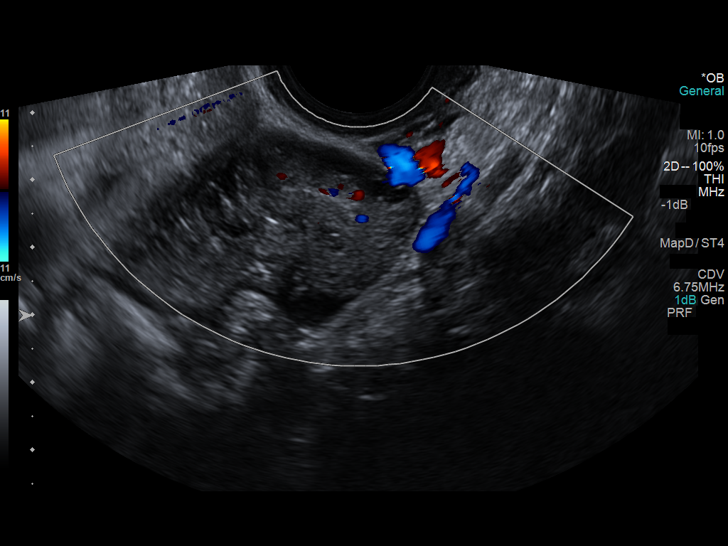
[im 56/76]
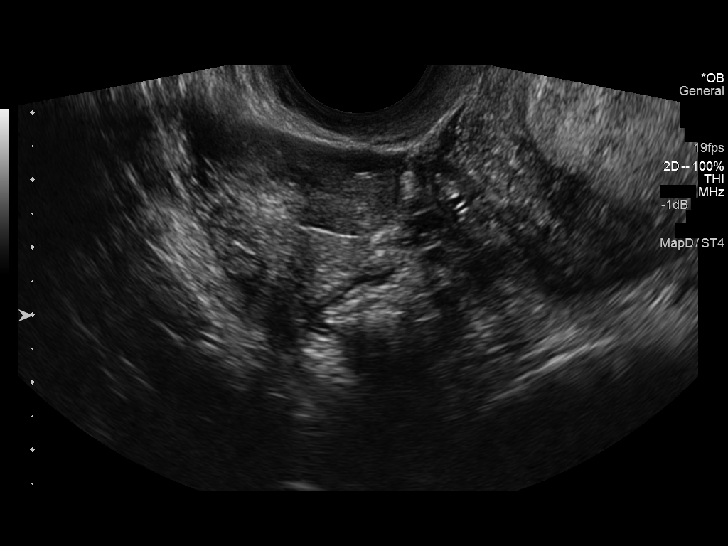
[im 62/76]
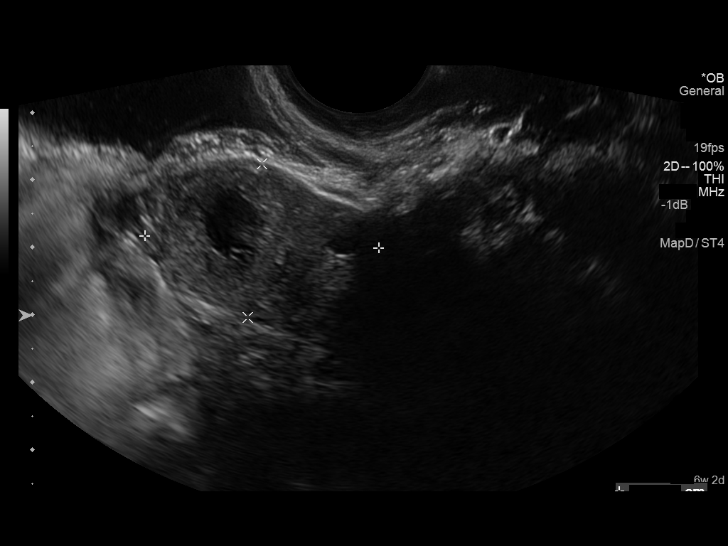
[im 67/76]
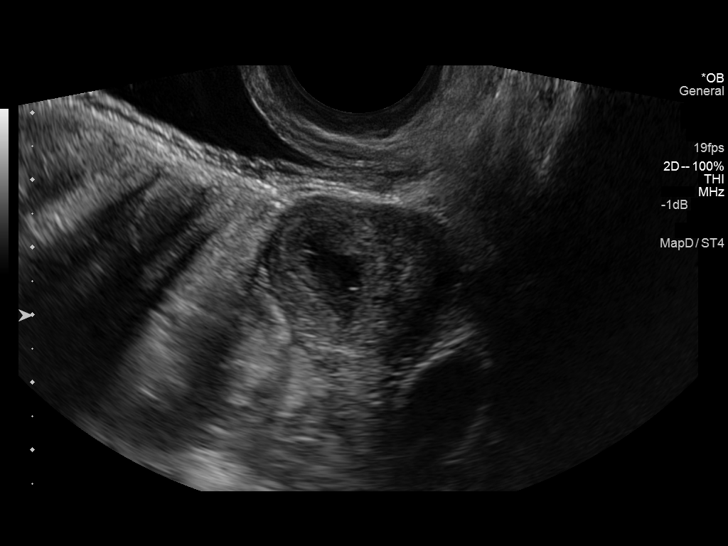
[im 73/76]
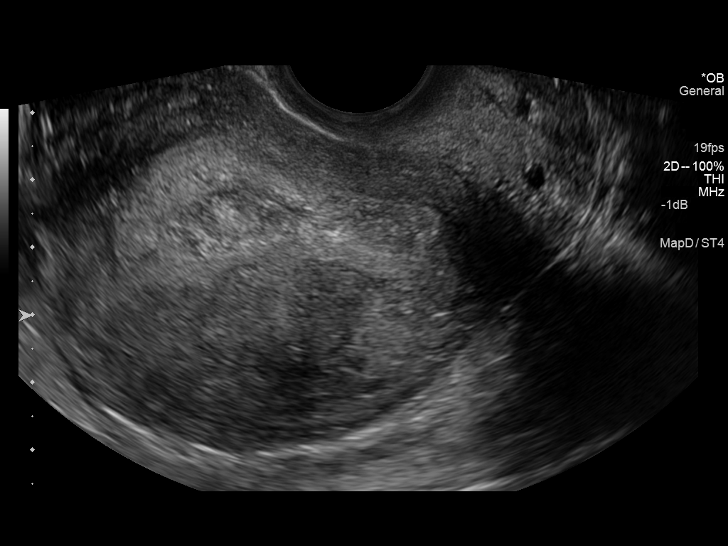

[13 of 28 positions shown; findings below may reference images not displayed]

FINDINGS: Intrauterine gestational sac: Visualized/normal in shape.

Yolk sac:  Present

Embryo:  Not identified

Cardiac Activity: Not applicable

MSD:  7.7  mm   5 w   4  d

US EDC: June 07, 2015

Maternal uterus/adnexae: No subchorionic hemorrhage. 1.5 x 1.4 x
cm hypoechoic avascular intramural leiomyoma within the posterior
uterine wall. No free fluid. Normal appearance of adnexa.
IMPRESSION: Probable early intrauterine gestational sac and yolk sac, without
fetal pole, or cardiac activity yet visualized. Recommend follow-up
quantitative B-HCG levels and follow-up US in 14 days to confirm and
assess viability. This recommendation follows SRU consensus
guidelines: Diagnostic Criteria for Nonviable Pregnancy Early in the
First Trimester. N Engl J Med 5130; [DATE].

1.5 cm intramural leiomyoma.

  By: Hj Samat Simah

## 2015-07-19 IMAGING — US US OB TRANSVAGINAL
1 series · 13 of 28 positions shown · non-contrast
Comparison: 10/09/2014

CLINICAL DATA: Pelvic pain. Followup. Estimated gestational age by
LMP is 7 weeks 3 days. Quantitative beta HCG was not ordered today.

EXAM:
TRANSVAGINAL OB ULTRASOUND
TECHNIQUE: Transvaginal ultrasound was performed for complete evaluation of the
gestation as well as the maternal uterus, adnexal regions, and
pelvic cul-de-sac.

[Series 1: us ob transvaginal · 29 acquisitions, 13 frames shown]
[im 2/29]
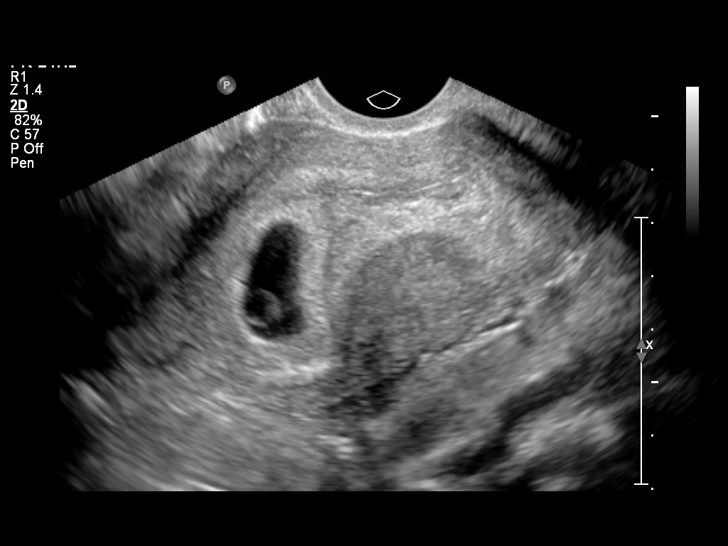
[im 4/29]
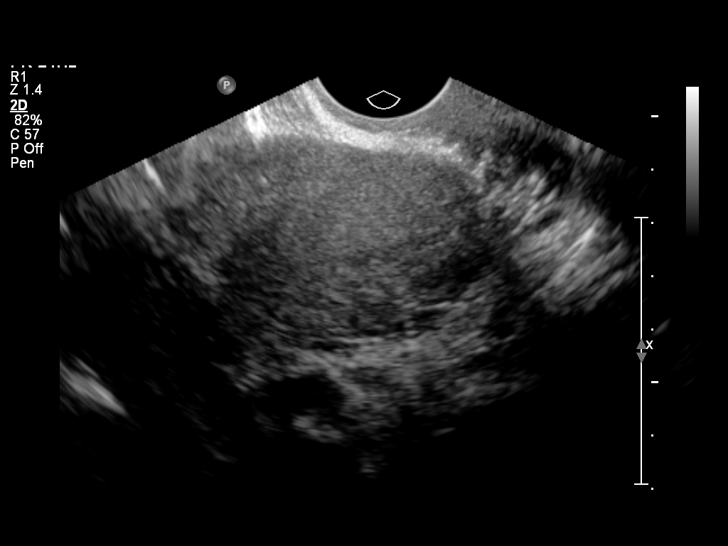
[im 6/29]
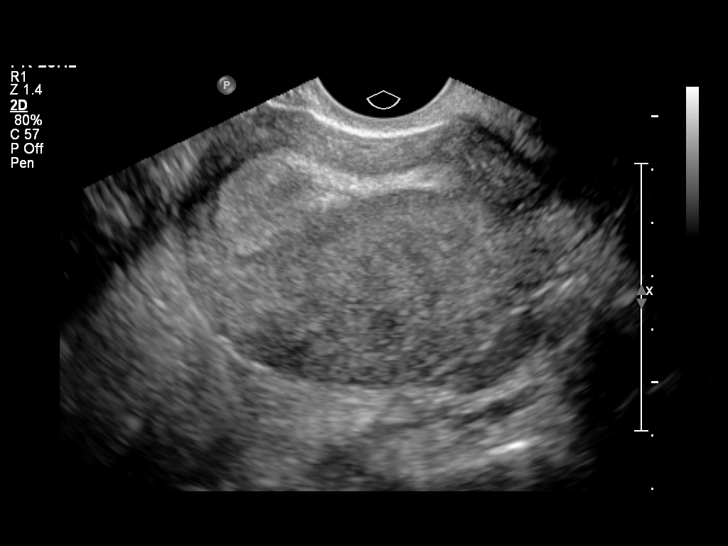
[im 8/29]
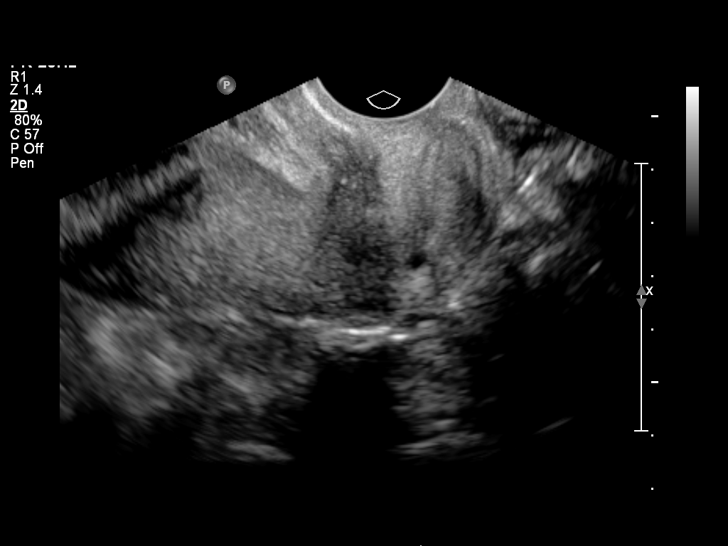
[im 10/29]
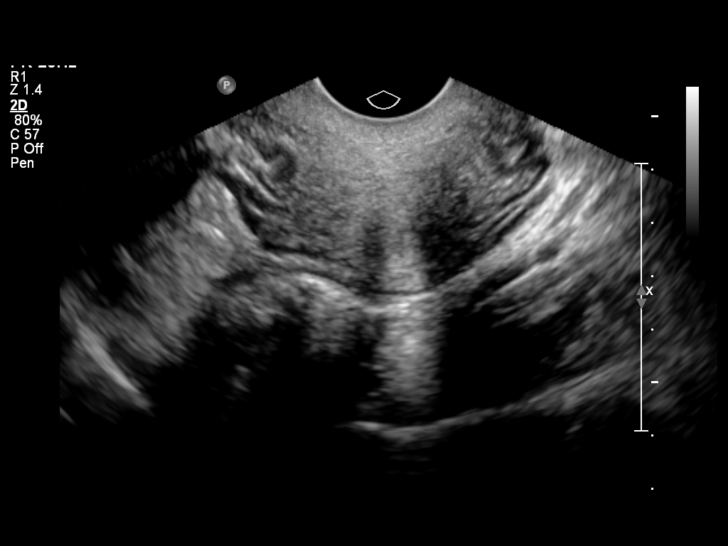
[im 12/29]
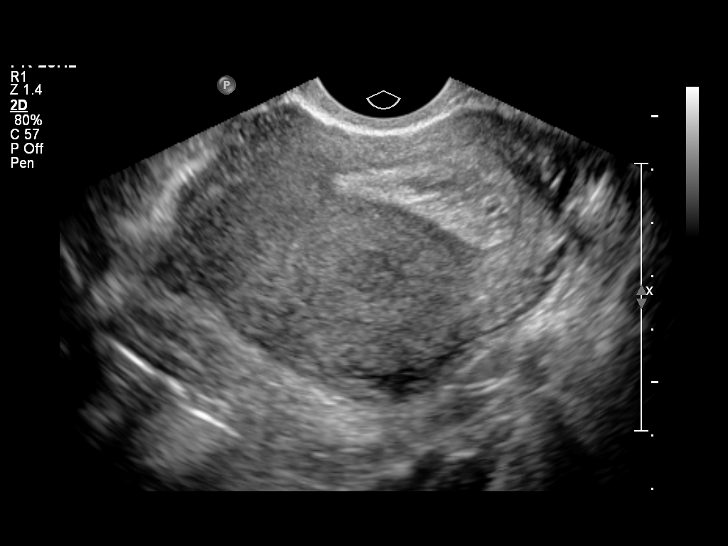
[im 15/29]
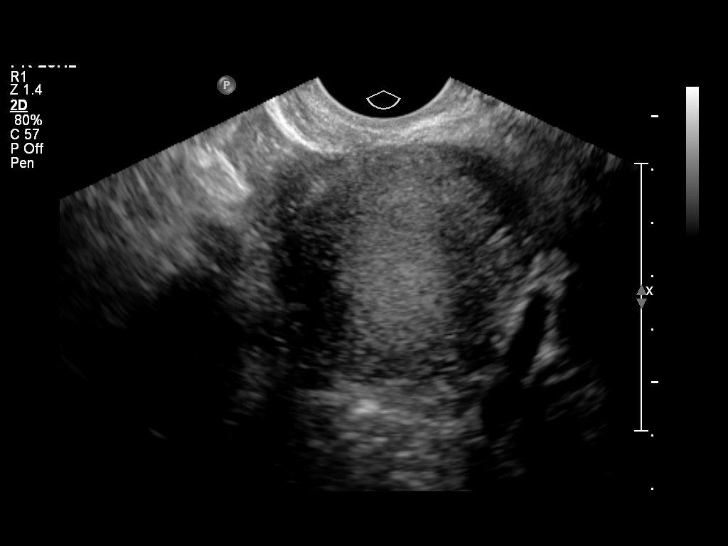
[im 17/29]
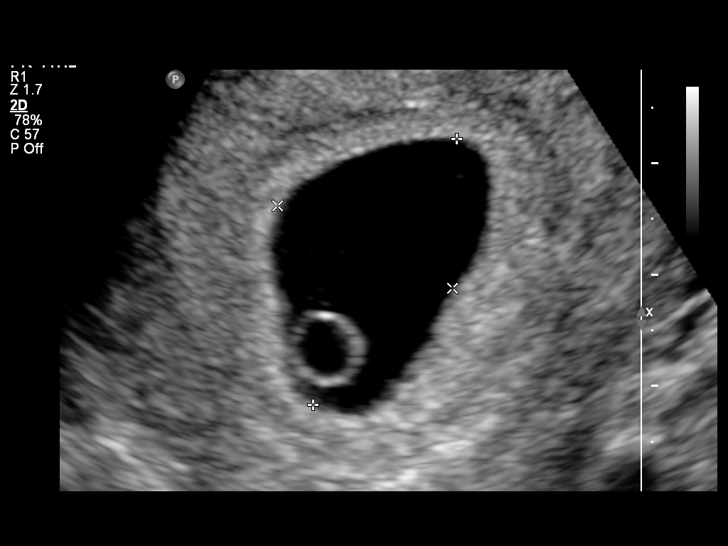
[im 19/29]
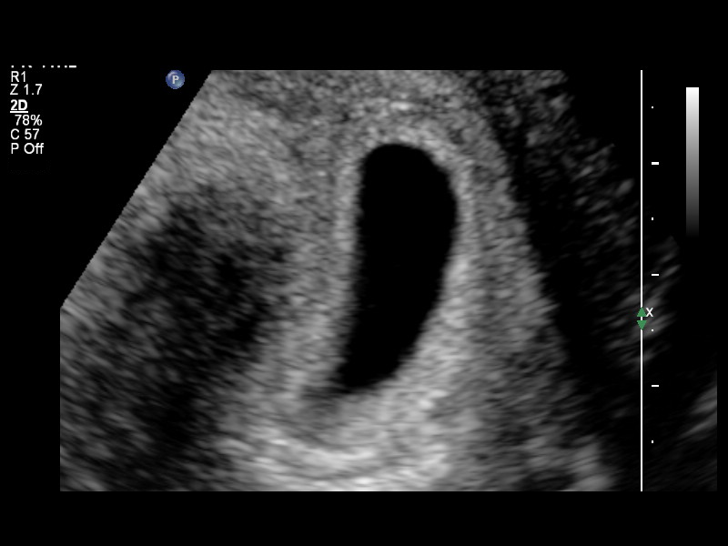
[im 21/29]
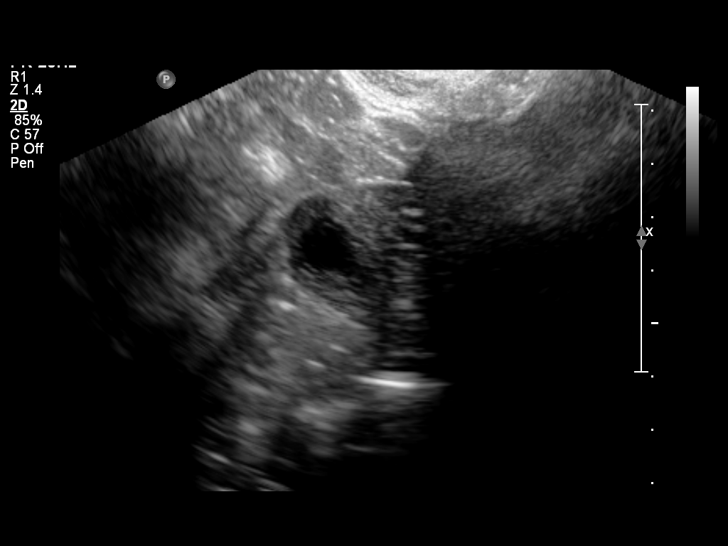
[im 23/29]
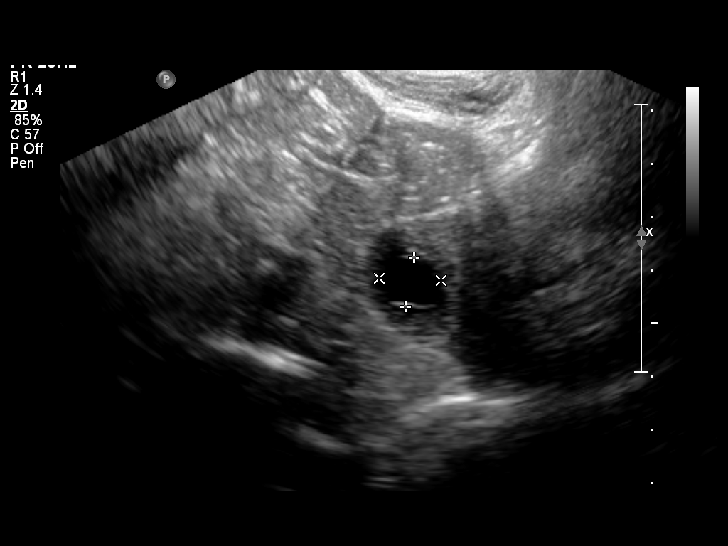
[im 25/29]
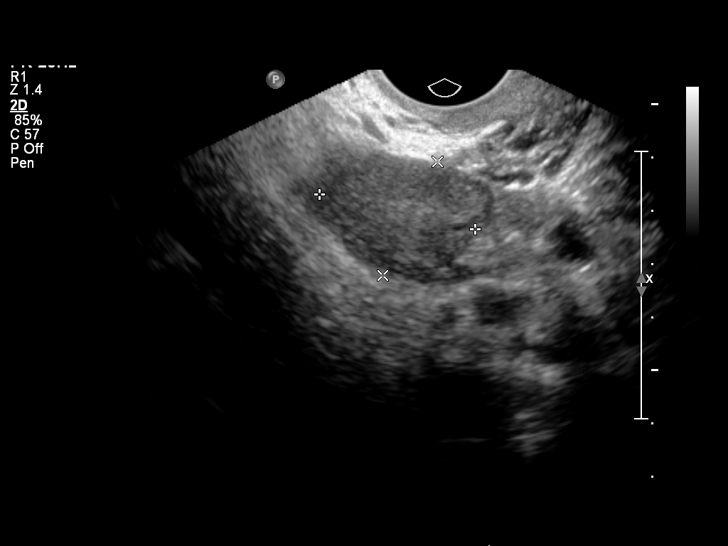
[im 27/29]
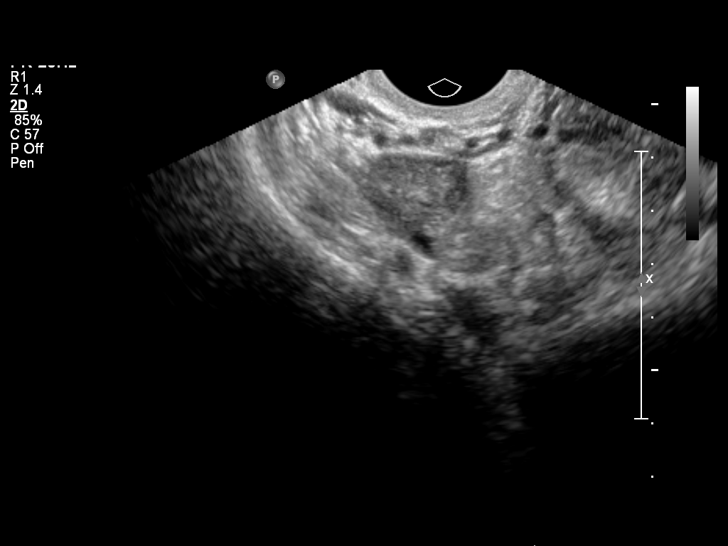

[13 of 28 positions shown; findings below may reference images not displayed]

FINDINGS: Intrauterine gestational sac: A single intrauterine gestational sac
is present.

Yolk sac:  Yolk sac is visualized.

Embryo:  Fetal pole is not identified.

Cardiac Activity: Not identified.

MSD: 18.4  mm   6 w   6  d                  US EDC: 06/11/2015

Maternal uterus/adnexae: Uterus appears slightly retroverted. There
is focal intramural myometrial mass lesion demonstrated measuring
about 1.4 cm diameter. This likely represents a small fibroid. No
subchorionic hemorrhage. Both ovaries are identified and appear
normal. Corpus luteum cyst noted on the left. No free pelvic fluid
collections.
IMPRESSION: Single intrauterine gestational sac with yolk sac visualized. Fetal
pole is not identified. Absence of embryo greater than 10 days after
ultrasound showing gestational sac with yolk sac meet criteria for
failed pregnancy. Findings meet definitive criteria for failed
pregnancy. This follows SRU consensus guidelines: Diagnostic
Criteria for Nonviable Pregnancy Early in the First Trimester. N
Engl J Med 6193;[DATE].

## 2015-08-26 ENCOUNTER — Encounter (HOSPITAL_COMMUNITY): Payer: Self-pay | Admitting: *Deleted

## 2015-09-05 ENCOUNTER — Inpatient Hospital Stay
Admission: EM | Admit: 2015-09-05 | Discharge: 2015-09-08 | Disposition: A | Discharge status: Home / Self Care | Source: Home / Self Care | Admitting: Internal Medicine

## 2015-09-05 ENCOUNTER — Encounter: Admit: 2015-09-05 | Primary: Internal Medicine

## 2015-09-05 DIAGNOSIS — N200 Calculus of kidney: Principal | ICD-10-CM

## 2015-09-05 LAB — CBC WITH AUTO DIFFERENTIAL
Basophils %: 0.4 %
Basophils Absolute: 0 10*3/uL (ref 0.0–0.2)
Eosinophils %: 0.4 %
Eosinophils Absolute: 0 10*3/uL (ref 0.0–0.6)
Hematocrit: 38.6 % (ref 36.0–48.0)
Hemoglobin: 12.4 g/dL (ref 12.0–16.0)
Lymphocytes %: 18.8 %
Lymphocytes Absolute: 1.7 10*3/uL (ref 1.0–5.1)
MCH: 28.8 pg (ref 26.0–34.0)
MCHC: 32.1 g/dL (ref 31.0–36.0)
MCV: 89.8 fL (ref 80.0–100.0)
MPV: 8.2 fL (ref 5.0–10.5)
Monocytes %: 2.3 %
Monocytes Absolute: 0.2 10*3/uL (ref 0.0–1.3)
Neutrophils %: 78.1 %
Neutrophils Absolute: 7.1 10*3/uL (ref 1.7–7.7)
Platelets: 321 10*3/uL (ref 135–450)
RBC: 4.3 M/uL (ref 4.00–5.20)
RDW: 16.2 % — ABNORMAL HIGH (ref 12.4–15.4)
WBC: 9 10*3/uL (ref 4.0–11.0)

## 2015-09-05 LAB — COMPREHENSIVE METABOLIC PANEL
ALT: 8 U/L — ABNORMAL LOW (ref 10–40)
AST: 17 U/L (ref 15–37)
Albumin/Globulin Ratio: 1.4 (ref 1.1–2.2)
Albumin: 4.4 g/dL (ref 3.4–5.0)
Alkaline Phosphatase: 68 U/L (ref 40–129)
Anion Gap: 16 (ref 3–16)
BUN: 6 mg/dL — ABNORMAL LOW (ref 7–20)
CO2: 22 mmol/L (ref 21–32)
Calcium: 9.2 mg/dL (ref 8.3–10.6)
Chloride: 103 mmol/L (ref 99–110)
Creatinine: 0.6 mg/dL (ref 0.6–1.1)
GFR African American: >60 (ref >60)
GFR Non-African American: >60 (ref >60)
Globulin: 3.2 g/dL
Glucose: 115 mg/dL — ABNORMAL HIGH (ref 70–99)
Potassium: 3.7 mmol/L (ref 3.5–5.1)
Sodium: 141 mmol/L (ref 136–145)
Total Bilirubin: 0.6 mg/dL (ref 0.0–1.0)
Total Protein: 7.6 g/dL (ref 6.4–8.2)

## 2015-09-05 LAB — MICROSCOPIC URINALYSIS
Epithelial Cells, UA: >36 /HPF — ABNORMAL HIGH (ref 0–5)
WBC, UA: 100 /HPF — ABNORMAL HIGH (ref 0–5)

## 2015-09-05 LAB — URINALYSIS WITH REFLEX TO CULTURE
Bilirubin Urine: NEGATIVE
Glucose, Ur: NEGATIVE mg/dL
Ketones, Urine: NEGATIVE mg/dL
Nitrite, Urine: POSITIVE — AB
Protein, UA: 30 mg/dL — AB
Specific Gravity, UA: 1.027 (ref 1.005–1.030)
Urobilinogen, Urine: 0.2 E.U./dL (ref <2.0)
pH, UA: 5.5 (ref 5.0–8.0)

## 2015-09-05 LAB — HCG, SERUM, QUALITATIVE: hCG Qual: NEGATIVE

## 2015-09-05 LAB — PREGNANCY, URINE: HCG(Urine) Pregnancy Test: NEGATIVE

## 2015-09-05 LAB — LIPASE: Lipase: 23 U/L (ref 13.0–60.0)

## 2015-09-05 MED ORDER — KETOROLAC TROMETHAMINE 15 MG/ML IJ SOLN
15 MG/ML | Freq: Four times a day (QID) | INTRAMUSCULAR | Status: DC | PRN
Start: 2015-09-05 — End: 2015-09-08
  Administered 2015-09-06 – 2015-09-08 (×5): 15 mg via INTRAVENOUS

## 2015-09-05 MED ORDER — KETOROLAC TROMETHAMINE 30 MG/ML IJ SOLN
30 MG/ML | Freq: Once | INTRAMUSCULAR | Status: AC
Start: 2015-09-05 — End: 2015-09-05
  Administered 2015-09-05: 16:00:00 30 mg via INTRAVENOUS

## 2015-09-05 MED ORDER — SODIUM CHLORIDE 0.9 % IV BOLUS
0.9 % | Freq: Once | INTRAVENOUS | Status: AC
Start: 2015-09-05 — End: 2015-09-05
  Administered 2015-09-05: 18:00:00 1000 mL via INTRAVENOUS

## 2015-09-05 MED ORDER — ONDANSETRON HCL 4 MG/2ML IJ SOLN
4 MG/2ML | Freq: Four times a day (QID) | INTRAMUSCULAR | Status: DC | PRN
Start: 2015-09-05 — End: 2015-09-08

## 2015-09-05 MED ORDER — NORMAL SALINE FLUSH 0.9 % IV SOLN
0.9 % | Freq: Two times a day (BID) | INTRAVENOUS | Status: DC
Start: 2015-09-05 — End: 2015-09-08
  Administered 2015-09-06 – 2015-09-08 (×5): 10 mL via INTRAVENOUS

## 2015-09-05 MED ORDER — MAGNESIUM HYDROXIDE 400 MG/5ML PO SUSP
400 MG/5ML | Freq: Every day | ORAL | Status: DC | PRN
Start: 2015-09-05 — End: 2015-09-08

## 2015-09-05 MED ORDER — HYDROMORPHONE 0.5MG/0.5ML IJ SOLN
1 MG/ML | Status: DC | PRN
Start: 2015-09-05 — End: 2015-09-08
  Administered 2015-09-05 – 2015-09-08 (×8): 0.5 mg via INTRAVENOUS

## 2015-09-05 MED ORDER — SODIUM CHLORIDE 0.9 % IV SOLN
0.9 % | INTRAVENOUS | Status: DC
Start: 2015-09-05 — End: 2015-09-06
  Administered 2015-09-05 – 2015-09-06 (×3): via INTRAVENOUS

## 2015-09-05 MED ORDER — IOPAMIDOL 76 % IV SOLN
76 % | Freq: Once | INTRAVENOUS | Status: AC | PRN
Start: 2015-09-05 — End: 2015-09-05
  Administered 2015-09-05: 17:00:00 75 mL via INTRAVENOUS

## 2015-09-05 MED ORDER — ENOXAPARIN SODIUM 40 MG/0.4ML SC SOLN
40 MG/0.4ML | Freq: Every day | SUBCUTANEOUS | Status: DC
Start: 2015-09-05 — End: 2015-09-08
  Administered 2015-09-07 – 2015-09-08 (×2): 40 mg via SUBCUTANEOUS

## 2015-09-05 MED ORDER — DIAZEPAM 5 MG/ML IJ SOLN
5 MG/ML | Freq: Once | INTRAMUSCULAR | Status: AC
Start: 2015-09-05 — End: 2015-09-05
  Administered 2015-09-05: 17:00:00 5 mg via INTRAVENOUS

## 2015-09-05 MED ORDER — NORMAL SALINE FLUSH 0.9 % IV SOLN
0.9 % | INTRAVENOUS | Status: DC | PRN
Start: 2015-09-05 — End: 2015-09-08

## 2015-09-05 MED ORDER — GENTAMICIN SULFATE 40 MG/ML IJ SOLN
40 MG/ML | Freq: Once | INTRAMUSCULAR | Status: DC
Start: 2015-09-05 — End: 2015-09-05
  Administered 2015-09-05: 19:00:00 330 mg via INTRAVENOUS

## 2015-09-05 MED ORDER — SODIUM CHLORIDE 0.9 % IV BOLUS
0.9 % | Freq: Once | INTRAVENOUS | Status: AC
Start: 2015-09-05 — End: 2015-09-05
  Administered 2015-09-05: 16:00:00 1000 mL via INTRAVENOUS

## 2015-09-05 MED ORDER — ACETAMINOPHEN 325 MG PO TABS
325 MG | ORAL | Status: DC | PRN
Start: 2015-09-05 — End: 2015-09-08

## 2015-09-05 MED ORDER — DEXTROSE 5 % IV SOLN (MINI-BAG)
5 % | INTRAVENOUS | Status: DC
Start: 2015-09-05 — End: 2015-09-06

## 2015-09-05 MED ORDER — ONDANSETRON HCL 4 MG/2ML IJ SOLN
4 MG/2ML | Freq: Once | INTRAMUSCULAR | Status: AC
Start: 2015-09-05 — End: 2015-09-05
  Administered 2015-09-05: 16:00:00 4 mg via INTRAVENOUS

## 2015-09-05 MED ORDER — MORPHINE SULFATE (PF) 4 MG/ML IV SOLN
4 MG/ML | Freq: Once | INTRAVENOUS | Status: AC
Start: 2015-09-05 — End: 2015-09-05
  Administered 2015-09-05: 17:00:00 4 mg via INTRAVENOUS

## 2015-09-05 MED ORDER — TAMSULOSIN HCL 0.4 MG PO CAPS
0.4 MG | Freq: Every day | ORAL | Status: DC
Start: 2015-09-05 — End: 2015-09-08
  Administered 2015-09-05 – 2015-09-08 (×4): 0.4 mg via ORAL

## 2015-09-05 MED ORDER — CEFTRIAXONE SODIUM 2 G IJ SOLR
2 g | Freq: Once | INTRAMUSCULAR | Status: AC
Start: 2015-09-05 — End: 2015-09-05
  Administered 2015-09-05: 18:00:00 2 g via INTRAVENOUS

## 2015-09-05 MED FILL — ONDANSETRON HCL 4 MG/2ML IJ SOLN: 4 MG/2ML | INTRAMUSCULAR | Qty: 2

## 2015-09-05 MED FILL — SODIUM CHLORIDE 0.9 % IV SOLN: 0.9 % | INTRAVENOUS | Qty: 1000

## 2015-09-05 MED FILL — HYDROMORPHONE HCL 1 MG/ML IJ SOLN: 1 MG/ML | INTRAMUSCULAR | Qty: 0.5

## 2015-09-05 MED FILL — CEFTRIAXONE SODIUM 2 G IJ SOLR: 2 g | INTRAMUSCULAR | Qty: 2

## 2015-09-05 MED FILL — DIAZEPAM 5 MG/ML IJ SOLN: 5 MG/ML | INTRAMUSCULAR | Qty: 2

## 2015-09-05 MED FILL — GENTAMICIN SULFATE 40 MG/ML IJ SOLN: 40 MG/ML | INTRAMUSCULAR | Qty: 8.25

## 2015-09-05 MED FILL — MORPHINE SULFATE (PF) 4 MG/ML IV SOLN: 4 MG/ML | INTRAVENOUS | Qty: 1

## 2015-09-05 MED FILL — TAMSULOSIN HCL 0.4 MG PO CAPS: 0.4 MG | ORAL | Qty: 1

## 2015-09-05 MED FILL — KETOROLAC TROMETHAMINE 30 MG/ML IJ SOLN: 30 MG/ML | INTRAMUSCULAR | Qty: 1

## 2015-09-05 NOTE — ED Notes (Signed)
Pt returned from CT. Reports improvement in pain to 7/10. VSS. Pt appears more comfortably. Call light in reach. Warm blanket provided for comfort.      Jolaine Click, RN  09/05/15 [phone]

## 2015-09-05 NOTE — ED Notes (Signed)
Arline Asp, ED tech at bs to obtain 2nd set of cultures.      Jolaine Click, RN  09/05/15 1340

## 2015-09-05 NOTE — ED Notes (Signed)
Pt reports no improvement in pain after toradol. Morphine administered per order. Call light in reach. Awaiting CT scan, pt awarre.      Jolaine Click, RN  09/05/15 1240

## 2015-09-05 NOTE — ED Notes (Signed)
Report given to Fleet Contras, Charity fundraiser. All questions answered.      Jolaine Click, RN  09/05/15 [phone]

## 2015-09-05 NOTE — ED Notes (Signed)
Dr. Alanda Slim at bs to update pt.      Jolaine Click, RN  09/05/15 [phone]

## 2015-09-05 NOTE — Plan of Care (Signed)
Problem: Safety:  Goal: Free from accidental physical injury  Free from accidental physical injury  Outcome: Ongoing  Goal: Free from intentional harm  Free from intentional harm  Outcome: Ongoing    Problem: Pain:  Goal: Patient???s pain/discomfort is manageable  Patient???s pain/discomfort is manageable  Outcome: Ongoing    Problem: Discharge Planning:  Goal: Patients continuum of care needs are met  Patients continuum of care needs are met  Outcome: Ongoing

## 2015-09-05 NOTE — ED Notes (Signed)
Pt presents to ED with c/o RLQ pain with N/V x 2 days. Pt denies diarrhea. IVFs and medications administered per order. VSS. Call light in reach. Warm blanket provided for comfort. Will ctm.      Jolaine Click, RN  09/05/15 [phone]

## 2015-09-05 NOTE — Progress Notes (Signed)
Consult received. Pt admitted for obstructing right ureteral stone with associated UTI.  Plan to place ureteral stent tomorrow am. Cont abx, please call if pt develops fevers and will place stent urgently.

## 2015-09-05 NOTE — H&P (Signed)
Hospital Medicine History & Physical      PCP: No primary care provider on file.    Date of Admission: 09/05/2015    Date of Service: Pt seen/examined on 09/05/2015    Chief Complaint:      Chief Complaint   Patient presents with   ??? Abdominal Pain     RLQ (near ovary) with associated n/v x 2 days       History Of Present Illness:     The patient is a 29 y.o. female who presents to [facility] with severe pain ,10/10, R side abd started today, with no aggravating or reliving factor assoc with vomiting . IN ER she was found to have 6 mm obstructing calculi with moderate hydronephrosis     Past Medical History:    History reviewed. No pertinent past medical history.    Past Surgical History:    History reviewed. No pertinent past surgical history.    Medications Prior to Admission:    Prior to Admission medications    Not on File       Allergies:  Review of patient's allergies indicates no known allergies.    Social History:       reports that she has been smoking Cigars.  She does not have any smokeless tobacco history on file. She reports that she drinks alcohol. She reports that she uses illicit drugs, including Marijuana.    Family History:  Reviewed in detail and negative for DM, Early CAD, Cancer, CVA. Positive as follows:    History reviewed. No pertinent family history.    REVIEW OF SYSTEMS:   Positive review  noted in the HPI. All other systems reviewed and negative.    PHYSICAL EXAM:    Visit Vitals   ??? BP 118/63   ??? Pulse 79   ??? Temp 97.6 ??F (36.4 ??C) (Axillary)   ??? Resp 14   ??? Ht  (1.651 m)   ??? Wt 145 lb 15.1 oz (66.2 kg)   ??? LMP 08/12/2015 (Exact Date)   ??? SpO2 100%   ??? BMI 24.29 kg/m2     General Appearance: alert and oriented to person, place and time, well developed and well- nourished, in no acute distress  Skin: warm and dry, no rash or erythema  Head: normocephalic and atraumatic  Eyes: pupils equal, round, and reactive to light, extraocular eye movements intact, conjunctivae normal  ENT:  tympanic membrane, external ear and ear canal normal bilaterally, nose without deformity, nasal mucosa and turbinates normal without polyps  Neck: supple and non-tender without mass, no thyromegaly or thyroid nodules, no cervical lymphadenopathy  Pulmonary/Chest: clear to auscultation bilaterally- no wheezes, rales or rhonchi, normal air movement, no respiratory distress  Cardiovascular: normal rate, regular rhythm, normal S1 and S2, no murmurs, rubs, clicks, or gallops, Peripheral pulses good, Cap refill <3 sec, no carotid bruits  Abdomen: soft, tender R middle quadrant , non-distended, normal bowel sounds, no masses or organomegaly  Extremities: no cyanosis, clubbing or edema  Musculoskeletal: normal range of motion, no joint swelling, deformity or tenderness  Neurologic: reflexes normal and symmetric, no cranial nerve deficit, gait, coordination and speech normal      LABS:    CT abd:  I have reviewed the CT with the following interpretation: R moderate hydronephrosis with 6 mm calculi        CBC   Recent Labs      09/05/15   1150   WBC  9.0   HGB  12.4   HCT  38.6   PLT  321      RENAL  Recent Labs      09/05/15   1150   NA  141   K  3.7   CL  103   CO2  22   BUN  6*   CREATININE  0.6     LFT'S  Recent Labs      09/05/15   1150   AST  17   ALT  8*   BILITOT  0.6   ALKPHOS  68     COAG  No results for input(s): INR in the last 72 hours.  CARDIAC ENZYMES  No results for input(s): CKTOTAL, CKMB, CKMBINDEX, TROPONINI in the last 72 hours.    U/A:    Lab Results   Component Value Date    COLORU YELLOW 09/05/2015    WBCUA 100 09/05/2015    RBCUA 5-10 09/05/2015    MUCUS 1+ 09/05/2015    BACTERIA 4+ 09/05/2015    CLARITYU CLOUDY 09/05/2015    SPECGRAV 1.027 09/05/2015    LEUKOCYTESUR SMALL 09/05/2015    BLOODU SMALL 09/05/2015    GLUCOSEU Negative 09/05/2015       ABG  No results found for: HCO3ART, BEART, O2SATART, PHART, THGBART, PCO2ART, PO2ART, TCO2ART    Previous medical records personally reviewed and analyzed          PHYSICIAN CERTIFICATION    I certify that Emmaclaire Switala is expected to be hospitalized for >2  midnights based on the following assessment and plan:    ASSESSMENT/PLAN:  There are no active hospital problems to display for this patient.    Acute R Hydronephrosis sec to obstructing ureteral calculi  - urology consulted  - IVF   - pain control    UTI  -ct ceftriaxone  - c/s          DVT Prophylaxis:  Lovenox, hold am dose  Diet: Diet NPO Effective Now  Code Status: No Order      Dispo - med surg        Venice Marcucci Achuthankutty, MD  The note was completed using EMR. Every effort was made to ensure accuracy; however, inadvertent computerized transcription errors may be present.       Thank you No primary care provider on file. for the opportunity to be involved in this patient's care. If you have any questions or concerns please feel free to contact me at [phone].

## 2015-09-05 NOTE — Progress Notes (Signed)
Pt is alert and oriented x4. C/o Pain to right side 8/10 PRN pain med given as ordered. Pt vitals WNL. Will continue to monitor. Call light in reach.

## 2015-09-05 NOTE — Progress Notes (Signed)
Patient arrived to floor. Ambulated to and from the bathroom. Oriented to room and call light. Will continue to monitor.

## 2015-09-05 NOTE — ED Notes (Signed)
Pt in CT.      Jolaine Click, RN  09/05/15 1302

## 2015-09-05 NOTE — ED Provider Notes (Signed)
[facility]  eMERGENCY dEPARTMENT eNCOUnter      Pt Name: Heather Sampson  MRN: [id number]  Birthdate 1986/10/12  Date of evaluation: 09/05/2015  Provider: Dion Saucier, PA    CHIEF COMPLAINT       Chief Complaint   Patient presents with   ??? Abdominal Pain     RLQ (near ovary) with associated n/v x 2 days         HISTORY OF PRESENT ILLNESS  (Location/Symptom, Timing/Onset, Context/Setting, Quality, Duration, Modifying Factors, Severity.)   Heather Sampson is a 29 y.o. female who presents to the emergency department for nausea, vomiting, abdominal pain that started 7 AM this morning.  Patient reports she had too numerous to count Episodes of Vomiting.  Denies hematEmesis or Coffee-Ground Emesis.  Reports Associated Right Mid Abdominal to Right Lower Abdominal Pain.  Described As Sharp Rated 10/10 with No Alleviating or Aggravating Factors.  She Did Try to Take Tylenol This Morning but Was Unable to Keep It down.  Denies Diarrhea.  Reports Chills but Denies Fever.  Denies Chest Pain, Shortness of Breath.  Denies Any Sick Contacts.  Last Menstraul Cycle Was 08/12/15.      Nursing Notes were reviewed and I agree.    REVIEW OF SYSTEMS    (2-9 systems for level 4, 10 or more for level 5)     Review of Systems   Constitutional: Positive for chills. Negative for fever.   Respiratory: Negative for shortness of breath.    Cardiovascular: Negative for chest pain.   Gastrointestinal: Positive for abdominal pain, nausea and vomiting. Negative for diarrhea.     Except as noted above the remainder of the review of systems was reviewed and negative.       PAST MEDICAL HISTORY   History reviewed. No pertinent past medical history.    SURGICAL HISTORY     History reviewed. No pertinent past surgical history.    CURRENT MEDICATIONS       There are no discharge medications for this patient.      ALLERGIES     Review of patient's allergies indicates no known allergies.    FAMILY HISTORY     History reviewed. No pertinent  family history.  No family status information on file.        SOCIAL HISTORY      reports that she has been smoking Cigars.  She does not have any smokeless tobacco history on file. She reports that she drinks alcohol. She reports that she uses illicit drugs, including Marijuana.    PHYSICAL EXAM    (up to 7 for level 4, 8 or more for level 5)   ED Triage Vitals   BP Temp Temp Source Pulse Resp SpO2 Height Weight   09/05/15 1139 09/05/15 1139 09/05/15 1139 09/05/15 1139 09/05/15 1139 09/05/15 1139 09/05/15 1139 09/05/15 1139   106/69 97 ??F (36.1 ??C) Oral 80 20 100 %  (1.651 m) 145 lb 15.1 oz (66.2 kg)       Physical Exam   Constitutional: She is oriented to person, place, and time. She appears well-developed and well-nourished. No distress.   HENT:   Head: Normocephalic and atraumatic.   Right Ear: External ear normal.   Left Ear: External ear normal.   Mouth/Throat: Oropharynx is clear and moist.   Eyes: EOM are normal.   Neck: Normal range of motion. Neck supple.   Cardiovascular: Normal rate, regular rhythm and normal heart sounds.  Pulmonary/Chest: Effort normal and breath sounds normal. No respiratory distress.   Abdominal: Soft. She exhibits no distension and no mass. There is tenderness (moderate TTP of the right mid and lower abdomen). There is no rebound and no guarding.   Hypoactive bowel sounds   Musculoskeletal: Normal range of motion.   Neurological: She is alert and oriented to person, place, and time.   Skin: Skin is warm and dry. She is not diaphoretic.   Psychiatric: She has a normal mood and affect. Her behavior is normal. Judgment and thought content normal.       DIFFERENTIAL DIAGNOSIS   Appendicitis, Small Bowel Obstruction, Pancreatitis, Cholesystitis, Hepatitis, Aortic Dissection, DKA, Pylonephritis, Kidney Stone    DIAGNOSTIC RESULTS         RADIOLOGY:   Non-plain film images such as CT, Ultrasound and MRI are read by the radiologist. Plain radiographic images are visualized and  preliminarily interpreted by Dion Saucier, PA with the below findings:        Interpretation per the Radiologist below, if available at the time of this note:    CT ABDOMEN PELVIS W IV CONTRAST   Final Result   Acute right obstructive uropathy secondary to 6 proximal right ureteral   calculi or stone fragments. The largest measures.  There are several other   nonobstructing right renal calculi/stone fragments in the right renal pelvis   and lower pole collecting system.  Is there a history of recent lithotripsy?               LABS:  Labs Reviewed   CBC WITH AUTO DIFFERENTIAL - Abnormal; Notable for the following:        Result Value    RDW 16.2 (*)     All other components within normal limits   COMPREHENSIVE METABOLIC PANEL - Abnormal; Notable for the following:     Glucose 115 (*)     BUN 6 (*)     ALT 8 (*)     All other components within normal limits   URINE RT REFLEX TO CULTURE - Abnormal; Notable for the following:     Clarity, UA CLOUDY (*)     Blood, Urine SMALL (*)     Protein, UA 30 (*)     Nitrite, Urine POSITIVE (*)     Leukocyte Esterase, Urine SMALL (*)     All other components within normal limits   MICROSCOPIC URINALYSIS - Abnormal; Notable for the following:     Mucus, UA 1+ (*)     RBC, UA 5-10 (*)     Bacteria, UA 4+ (*)     Crystals 1+ Ca. Oxalate (*)     WBC, UA 100 (*)     Epi Cells >36 (*)     All other components within normal limits   URINE CULTURE   CULTURE BLOOD #1   CULTURE BLOOD #2   LIPASE   PREGNANCY, URINE   HCG, SERUM, QUALITATIVE       All other labs were within normal range or not returned as of this dictation.    EMERGENCY DEPARTMENT COURSE and DIFFERENTIAL DIAGNOSIS/MDM:   Vitals:    Vitals:    09/05/15 1358 09/05/15 1417 09/05/15 1449 09/05/15 1618   BP: 135/72 118/63 124/81    Pulse: 76 79 75    Resp: Temp:  97.6 ??F (36.4 ??C) 98.1 ??F (36.7 ??C)    TempSrc:  Axillary Oral    SpO2:  100% 100% 100% 97%   Weight:   149 lb 4 oz (67.7 kg)    Height:     (1.651 m)        Patient was seen and examiend by myself and Romano.  She is nontoxic, vomiting in triage, with normal vital signs in no acute distress.Marland Kitchen  She was ordered a bolus, Zofran, labs, CT. EC, CMP, lipase are all normal. Preg negative.  Was given toradol and morphine for pain. Urinalysis with positive nitrite, small leukocytes, has 100 whites. Apperars Infected.  Given ceftriaxone empirically while awaiting CT. Concern for kidney stone.  CT of the abdomen and pelvis with acute right obstructive uropathy secondary 6 proximal right ureteral stones or stone fragments. Requires admission.  Dr. Cherlynn Kaiser spoke with urology who will stent tomorrow.  He also spoke with Dr. Loni Beckwith who will admit the patient.  This was explained to the patient who agreed and understood.. Reevaluation, she is lying comfortably in bed and reports she is feeling better.    This patient was seen by the attending physician and they agreed with the assessment and plan.    CONSULTS:  IP CONSULT TO UROLOGY    PROCEDURES:  None    FINAL IMPRESSION      1. Kidney stone    2. Acute pyelonephritis          DISPOSITION/PLAN   DISPOSITION Admitted    PATIENT REFERRED TO:  No follow-up provider specified.    DISCHARGE MEDICATIONS:  There are no discharge medications for this patient.      (Please note that portions of this note were completed with a voice recognition program.  Efforts were made to edit the dictations but occasionally words are mis-transcribed.)    Dion Saucier, [address] [place], [place]  09/05/15 1734

## 2015-09-05 NOTE — ED Provider Notes (Addendum)
I independently examined and evaluated Heather Sampson.    In brief, Previously well female with fairly acute onset of right lower quadrant pain over the last 2 days with loose stool.  She is also been nauseous and had episodes of vomiting.  No significant past medical history.  She denies pregnancy.  She's not found anything that makes the symptoms better or worse.  No shortness of breath or chest pain.  No vaginal discharge.      Focused exam revealed initially uncomfortable female but with normal vital signs.  Afebrile.  Lungs are clear to auscultation.  Positive flank pain with no bruising.  Positive right lower quadrant pain with no peritoneal signs.  Normal bowel sounds.  No distention.  Heart regular rate and rhythm with no tachycardia or murmurs.  Upper extremities and lower extremity with normal strength.  Strong radial and dorsal pedis pulses    ED course: CBC with white count of 9.0 and shift to the left.  Hemoglobin normal no evidence of bleeding.  Kidney function normal.  LFTs normal.  Lites normal.  Lipase normal.  Pregnancy negative    UA is positive for evidence of infection.  This is been sent for culture.  1 g of Rocephin IV piggyback has been given as well as an initial 5 mg/kg dose of gentamicin    CT scan demonstrates a 6 mm obstructing stone    I contacted urology who plans on placing a stent first thing in the morning.  Contacted the hospitalist who is graciously agreed to admit.    2 L of fluids, patient's n.p.o., pain medications as well as Valium have greatly diminished the patient's symptoms and she is feeling better.  She verbalizes understanding of the plan    All diagnostic, treatment, and disposition decisions were made by myself in conjunction with the advanced practice provider.    For all further details of the patient's emergency department visit, please see the advanced practice provider's documentation.    Comment: Please note this report has been produced using speech  recognition software and may contain errors related to that system including errors in grammar, punctuation, and spelling, as well as words and phrases that may be inappropriate. If there are any questions or concerns please feel free to contact the dictating provider for clarification.     Nadyne Coombes, MD  09/05/15 1421       Nadyne Coombes, MD  09/05/15 1421

## 2015-09-06 ENCOUNTER — Encounter: Admit: 2015-09-06 | Attending: Surgery | Primary: Internal Medicine

## 2015-09-06 LAB — CBC
Hematocrit: 31.6 % — ABNORMAL LOW (ref 36.0–48.0)
Hemoglobin: 10.5 g/dL — ABNORMAL LOW (ref 12.0–16.0)
MCH: 29.8 pg (ref 26.0–34.0)
MCHC: 33.2 g/dL (ref 31.0–36.0)
MCV: 89.7 fL (ref 80.0–100.0)
MPV: 8.1 fL (ref 5.0–10.5)
Platelets: 213 10*3/uL (ref 135–450)
RBC: 3.52 M/uL — ABNORMAL LOW (ref 4.00–5.20)
RDW: 15.9 % — ABNORMAL HIGH (ref 12.4–15.4)
WBC: 15.9 10*3/uL — ABNORMAL HIGH (ref 4.0–11.0)

## 2015-09-06 LAB — BASIC METABOLIC PANEL
Anion Gap: 12 (ref 3–16)
BUN: 7 mg/dL (ref 7–20)
CO2: 21 mmol/L (ref 21–32)
Calcium: 7.8 mg/dL — ABNORMAL LOW (ref 8.3–10.6)
Chloride: 105 mmol/L (ref 99–110)
Creatinine: 0.8 mg/dL (ref 0.6–1.1)
GFR African American: >60 (ref >60)
GFR Non-African American: >60 (ref >60)
Glucose: 87 mg/dL (ref 70–99)
Potassium: 3.9 mmol/L (ref 3.5–5.1)
Sodium: 138 mmol/L (ref 136–145)

## 2015-09-06 LAB — CULTURE, BLOOD, PCR ID PANEL RESULTS REPORT

## 2015-09-06 MED ORDER — HYDROMORPHONE 0.5MG/0.5ML IJ SOLN
1 MG/ML | Status: DC | PRN
Start: 2015-09-06 — End: 2015-09-06

## 2015-09-06 MED ORDER — PHENAZOPYRIDINE HCL 100 MG PO TABS
100 MG | Freq: Three times a day (TID) | ORAL | Status: DC | PRN
Start: 2015-09-06 — End: 2015-09-08
  Administered 2015-09-07 – 2015-09-08 (×4): 100 mg via ORAL

## 2015-09-06 MED ORDER — FENTANYL CITRATE (PF) 100 MCG/2ML IJ SOLN
100 MCG/2ML | INTRAMUSCULAR | Status: AC
Start: 2015-09-06 — End: ?

## 2015-09-06 MED ORDER — PROPOFOL 200 MG/20ML IV EMUL
200 MG/20ML | INTRAVENOUS | Status: AC
Start: 2015-09-06 — End: ?

## 2015-09-06 MED ORDER — CEFTRIAXONE SODIUM 2 G IJ SOLR
2 g | INTRAMUSCULAR | Status: DC
Start: 2015-09-06 — End: 2015-09-08
  Administered 2015-09-06 – 2015-09-08 (×3): 2 g via INTRAVENOUS

## 2015-09-06 MED ORDER — PROMETHAZINE HCL 25 MG/ML IJ SOLN
25 MG/ML | Freq: Once | INTRAMUSCULAR | Status: DC | PRN
Start: 2015-09-06 — End: 2015-09-06

## 2015-09-06 MED ORDER — ONDANSETRON HCL 4 MG/2ML IJ SOLN
4 MG/2ML | INTRAMUSCULAR | Status: AC
Start: 2015-09-06 — End: ?

## 2015-09-06 MED ORDER — LABETALOL HCL 5 MG/ML IV SOLN
5 MG/ML | INTRAVENOUS | Status: DC | PRN
Start: 2015-09-06 — End: 2015-09-06

## 2015-09-06 MED ORDER — LIDOCAINE HCL 2 % IJ SOLN
2 % | INTRAMUSCULAR | Status: AC
Start: 2015-09-06 — End: ?

## 2015-09-06 MED ORDER — SODIUM CHLORIDE 0.9 % IJ SOLN
0.9 % | INTRAMUSCULAR | Status: AC
Start: 2015-09-06 — End: ?

## 2015-09-06 MED ORDER — CEFAZOLIN SODIUM 1 G IJ SOLR
1 g | INTRAMUSCULAR | Status: AC
Start: 2015-09-06 — End: ?

## 2015-09-06 MED ORDER — HYDROCODONE-ACETAMINOPHEN 5-325 MG PO TABS
5-325 MG | Freq: Four times a day (QID) | ORAL | Status: DC | PRN
Start: 2015-09-06 — End: 2015-09-08
  Administered 2015-09-06 – 2015-09-08 (×5): 1 via ORAL

## 2015-09-06 MED ORDER — FENTANYL CITRATE (PF) 100 MCG/2ML IJ SOLN
100 MCG/2ML | INTRAMUSCULAR | Status: DC | PRN
Start: 2015-09-06 — End: 2015-09-06

## 2015-09-06 MED ORDER — CEFAZOLIN SODIUM-DEXTROSE 2-3 GM-% IV SOLR
2-3 GM-% | Freq: Once | INTRAVENOUS | Status: AC
Start: 2015-09-06 — End: 2015-09-06
  Administered 2015-09-06: 13:00:00 2 g via INTRAVENOUS

## 2015-09-06 MED FILL — SODIUM CHLORIDE 0.9 % IV SOLN: 0.9 % | INTRAVENOUS | Qty: 1000

## 2015-09-06 MED FILL — HYDROMORPHONE HCL 1 MG/ML IJ SOLN: 1 MG/ML | INTRAMUSCULAR | Qty: 0.5

## 2015-09-06 MED FILL — ONDANSETRON HCL 4 MG/2ML IJ SOLN: 4 MG/2ML | INTRAMUSCULAR | Qty: 2

## 2015-09-06 MED FILL — FRESENIUS PROPOVEN 200 MG/20ML IV EMUL: 200 MG/20ML | INTRAVENOUS | Qty: 20

## 2015-09-06 MED FILL — SODIUM CHLORIDE 0.9 % IJ SOLN: 0.9 % | INTRAMUSCULAR | Qty: 10

## 2015-09-06 MED FILL — FENTANYL CITRATE (PF) 100 MCG/2ML IJ SOLN: 100 MCG/2ML | INTRAMUSCULAR | Qty: 2

## 2015-09-06 MED FILL — HYDROCODONE-ACETAMINOPHEN 5-325 MG PO TABS: 5-325 MG | ORAL | Qty: 1

## 2015-09-06 MED FILL — LIDOCAINE HCL 2 % IJ SOLN: 2 % | INTRAMUSCULAR | Qty: 40

## 2015-09-06 MED FILL — NORMAL SALINE FLUSH 0.9 % IV SOLN: 0.9 % | INTRAVENOUS | Qty: 10

## 2015-09-06 MED FILL — CEFAZOLIN SODIUM 1 G IJ SOLR: 1 g | INTRAMUSCULAR | Qty: 2000

## 2015-09-06 MED FILL — CEFTRIAXONE SODIUM 2 G IJ SOLR: 2 g | INTRAMUSCULAR | Qty: 2

## 2015-09-06 MED FILL — TAMSULOSIN HCL 0.4 MG PO CAPS: 0.4 MG | ORAL | Qty: 1

## 2015-09-06 MED FILL — FRESENIUS PROPOVEN 200 MG/20ML IV EMUL: 200 MG/20ML | INTRAVENOUS | Qty: 40

## 2015-09-06 NOTE — Plan of Care (Signed)
Problem: Safety:  Goal: Free from accidental physical injury  Free from accidental physical injury   Outcome: Ongoing  Patient uses call light appropriately to express needs. Bed to lowest position with door open and call light in reach. Siderails up x2. Non skid footwear in place. Will continue to monitor.        Problem: Pain:  Goal: Patient???s pain/discomfort is manageable  Patient???s pain/discomfort is manageable   Outcome: Ongoing  Pain/discomfort being managed with PRN analgesics per MD orders. Pt able to express presence and absence of pain and rate pain appropriately using numerical scale.

## 2015-09-06 NOTE — Anesthesia Pre-Procedure Evaluation (Signed)
Department of Anesthesiology  Preprocedure Note       Name:  Heather Sampson   Age:  29 y.o.  DOB:  23-Dec-1985                                          MRN:  [id number]         Date:  09/06/2015          [facility] Department of Anesthesiology  Pre-Anesthesia Evaluation/Consultation       Name:  Heather Sampson                                         Age:  29 y.o.  MRN:  [id number]           Procedure (Scheduled):  Cystoscopy right stent placement  Surgeon:  Dr. Victoriano Lain     No Known Allergies  There is no problem list on file for this patient.    History reviewed. No pertinent past medical history.  History reviewed. No pertinent past surgical history.  Social History   Substance Use Topics   ??? Smoking status: Current Every Day Smoker     Types: Cigars   ??? Smokeless tobacco: None      Comment: 1 black and mild/day   ??? Alcohol use Yes      Comment: 1 drink/month     Medications  No current facility-administered medications on file prior to encounter.      No current outpatient prescriptions on file prior to encounter.     Current Facility-Administered Medications   Medication Dose Route Frequency Provider Last Rate Last Dose   ??? ceFAZolin (ANCEF) 2 g in dextrose 3% 50mL IVPB (duplex)  2 g Intravenous Once Vicenta Aly, MD       ??? [facility] Hold] cefTRIAXone (ROCEPHIN) 1 g IVPB in 50 mL D5W minibag  1 g Intravenous Q24H Smitha Achuthankutty, MD       ??? [MAR Hold] sodium chloride flush 0.9 % injection 10 mL  10 mL Intravenous 2 times per day Jim Desanctis, MD   Stopped at 09/06/15 0759   ??? [MAR Hold] sodium chloride flush 0.9 % injection 10 mL  10 mL Intravenous PRN Smitha Achuthankutty, MD       ??? [MAR Hold] acetaminophen (TYLENOL) tablet 650 mg  650 mg Oral Q4H PRN Smitha Achuthankutty, MD       ??? [MAR Hold] magnesium hydroxide (MILK OF MAGNESIA) 400 MG/5ML suspension 30 mL  30 mL Oral Daily PRN Smitha Achuthankutty, MD       ??? [MAR Hold] ondansetron (ZOFRAN) injection 4 mg  4 mg Intravenous Q6H PRN Smitha  Achuthankutty, MD       ??? [MAR Hold] enoxaparin (LOVENOX) injection 40 mg  40 mg Subcutaneous Daily Smitha Achuthankutty, MD   Stopped at 09/06/15 0741   ??? [MAR Hold] HYDROmorphone (DILAUDID) injection 0.5 mg  0.5 mg Intravenous Q3H PRN Smitha Achuthankutty, MD   0.5 mg at 09/06/15 0729   ??? [MAR Hold] 0.9 % sodium chloride infusion   Intravenous Continuous Smitha Achuthankutty, MD 150 mL/hr at 09/06/15 0325     ??? [MAR Hold] tamsulosin (FLOMAX) capsule 0.4 mg  0.4 mg Oral Daily Smitha Achuthankutty, MD   0.4 mg at 09/05/15 1658   ??? [facility]  Hold] ketorolac (TORADOL) injection 15 mg  15 mg Intravenous Q6H PRN Smitha Achuthankutty, MD         Vital Signs (Current)   Vitals:    09/06/15 0823   BP: 110/74   Pulse: 88   Resp: 14   Temp: 99.8 ??F (37.7 ??C)   SpO2: 98%     Vital Signs Statistics (for past 48 hrs)     BP  Min: 98/57   Min taken time: 09/05/15 2225  Max: 135/72   Max taken time: 09/05/15 1358  Temp  Avg: 98.5 ??F (36.9 ??C)  Min: 97 ??F (36.1 ??C)   Min taken time: 09/05/15 1139  Max: 99.8 ??F (37.7 ??C)   Max taken time: 09/06/15 0823  Pulse  Avg: 81.4  Min: 72   Min taken time: 09/05/15 1210  Max: 97   Max taken time: 09/05/15 2225  Resp  Avg: 16.2  Min: 14   Min taken time: 09/06/15 0823  Max: 20   Max taken time: 09/05/15 2225  SpO2  Avg: 98.8 %  Min: 97 %   Min taken time: 09/06/15 0642  Max: 100 %   Max taken time: 09/05/15 1449    BP Readings from Last 3 Encounters:   09/06/15 110/74     BMI  Body mass index is 24.95 kg/(m^2).  Estimated body mass index is 24.95 kg/(m^2) as calculated from the following:    Height as of this encounter:  (1.651 m).    Weight as of this encounter: 149 lb 14.6 oz (68 kg).    CBC   Lab Results   Component Value Date    WBC 15.9 09/06/2015    RBC 3.52 09/06/2015    HGB 10.5 09/06/2015    HCT 31.6 09/06/2015    MCV 89.7 09/06/2015    RDW 15.9 09/06/2015    PLT 213 09/06/2015     CMP    Lab Results   Component Value Date    NA 138 09/06/2015    K 3.9 09/06/2015    CL 105 09/06/2015     CO2 21 09/06/2015    BUN 7 09/06/2015    CREATININE 0.8 09/06/2015    GFRAA >60 09/06/2015    AGRATIO 1.4 09/05/2015    LABGLOM >60 09/06/2015    GLUCOSE 87 09/06/2015    PROT 7.6 09/05/2015    CALCIUM 7.8 09/06/2015    BILITOT 0.6 09/05/2015    ALKPHOS 68 09/05/2015    AST 17 09/05/2015    ALT 8 09/05/2015     BMP    Lab Results   Component Value Date    NA 138 09/06/2015    K 3.9 09/06/2015    CL 105 09/06/2015    CO2 21 09/06/2015    BUN 7 09/06/2015    CREATININE 0.8 09/06/2015    CALCIUM 7.8 09/06/2015    GFRAA >60 09/06/2015    LABGLOM >60 09/06/2015    GLUCOSE 87 09/06/2015     POCGlucose  Recent Labs      09/05/15   1150  09/06/15   0548   GLUCOSE  115*  87      Coags  No results found for: PROTIME, INR, APTT  HCG (If Applicable)   Lab Results   Component Value Date    PREGTESTUR Negative 09/05/2015      ABGs No results found for: PHART, PO2ART, PCO2ART, HCO3ART, BEART, O2SATART   Type & Screen (If Applicable)  No results found  for: LABABO, LABRH        NPO Status: Time of last liquid consumption: 2300                        Time of last solid consumption: 0800                        Date of last liquid consumption: 09/06/15                        Date of last solid food consumption: 09/05/15    BMI:   Wt Readings from Last 3 Encounters:   09/06/15 149 lb 14.6 oz (68 kg)     Body mass index is 24.95 kg/(m^2).    Anesthesia Evaluation  Patient summary reviewed no history of anesthetic complications:   Airway: Mallampati: II  TM distance: >3 FB   Neck ROM: full   Dental: normal exam         Pulmonary:negative ROS and normal exam           Cardiovascular:negative ROS            Rhythm: regular  Rate: normal              Beta Blocker:  Not on Beta Blocker   Neuro/Psych:   {neg ROS     GI/Hepatic/Renal: neg ROS          Endo/Other: negative ROS         Abdominal:                    Anesthesia Plan    ASA 2 - emergent     MAC and TIVA     intravenous induction   Anesthetic plan and risks discussed with  patient.    DOS STAFF ADDENDUM:    Pt seen and examined, chart reviewed (including anesthesia, drug and allergy history).  No interval changes to history and physical examination.  Anesthetic plan, risks, benefits, alternatives, and personnel involved discussed with patient.  Patient verbalized an understanding and agrees to proceed.      Erlinda Hong, MD  September 06, 2015  8:55 AM          Erlinda Hong, MD   09/06/2015

## 2015-09-06 NOTE — Progress Notes (Signed)
Awake. MAE x 4. Denies pain/nausea. VSS on RA

## 2015-09-06 NOTE — Consults (Signed)
Reason for Consult: kidney stone    History of Present Illness: Heather Sampson is a 29 y.o. with no prior GU hx, admitted yesterday with right flank pain. CT showed several obstructing right ureteral stones, associated non obstructing right renal stones.  She has UA+ for uti on admission. No fevers.    Past Medical History:   History reviewed. No pertinent past medical history.    Past Surgical History:  History reviewed. No pertinent past surgical history.    Social History:  Social History     Social History   ??? Marital status: Single     Spouse name: N/A   ??? Number of children: N/A   ??? Years of education: N/A     Occupational History   ??? Not on file.     Social History Main Topics   ??? Smoking status: Current Every Day Smoker     Types: Cigars   ??? Smokeless tobacco: Not on file      Comment: 1 black and mild/day   ??? Alcohol use Yes      Comment: 1 drink/month   ??? Drug use: Yes     Special: Marijuana   ??? Sexual activity: Not on file     Other Topics Concern   ??? Not on file     Social History Narrative       Meds:   Current Facility-Administered Medications: ceFAZolin (ANCEF) 2 g in dextrose 3% 50mL IVPB (duplex), 2 g, Intravenous, Once  fentaNYL (SUBLIMAZE) injection 25 mcg, 25 mcg, Intravenous, Q5 Min PRN  HYDROmorphone (DILAUDID) injection 0.5 mg, 0.5 mg, Intravenous, Q5 Min PRN  promethazine (PHENERGAN) injection 6.25 mg, 6.25 mg, Intravenous, Once PRN  labetalol (NORMODYNE;TRANDATE) injection 5 mg, 5 mg, Intravenous, Q10 Min PRN  [MAR Hold] cefTRIAXone (ROCEPHIN) 1 g IVPB in 50 mL D5W minibag, 1 g, Intravenous, Q24H  [MAR Hold] sodium chloride flush 0.9 % injection 10 mL, 10 mL, Intravenous, 2 times per day  [facility] Hold] sodium chloride flush 0.9 % injection 10 mL, 10 mL, Intravenous, PRN  [MAR Hold] acetaminophen (TYLENOL) tablet 650 mg, 650 mg, Oral, Q4H PRN  [MAR Hold] magnesium hydroxide (MILK OF MAGNESIA) 400 MG/5ML suspension 30 mL, 30 mL, Oral, Daily PRN  [MAR Hold] ondansetron (ZOFRAN)  injection 4 mg, 4 mg, Intravenous, Q6H PRN  [MAR Hold] enoxaparin (LOVENOX) injection 40 mg, 40 mg, Subcutaneous, Daily  [MAR Hold] HYDROmorphone (DILAUDID) injection 0.5 mg, 0.5 mg, Intravenous, Q3H PRN  [MAR Hold] 0.9 % sodium chloride infusion, , Intravenous, Continuous  [MAR Hold] tamsulosin (FLOMAX) capsule 0.4 mg, 0.4 mg, Oral, Daily  [MAR Hold] ketorolac (TORADOL) injection 15 mg, 15 mg, Intravenous, Q6H PRN    Vitals:  Visit Vitals   ??? BP 110/74   ??? Pulse 94   ??? Temp 97.2 ??F (36.2 ??C) (Temporal)   ??? Resp 14   ??? Ht  (1.651 m)   ??? Wt 149 lb 14.6 oz (68 kg)   ??? LMP 08/12/2015 (Exact Date)   ??? SpO2 98%   ??? BMI 24.95 kg/m2       Intake/Output Summary (Last 24 hours) at 09/06/15 1610  Last data filed at 09/06/15 9604   Gross per 24 hour   Intake   2890 ml   Output      0 ml   Net   2890 ml       Physical Exam:  General Appearance: Alert and oriented, cooperative, no distress, appears stated age  Head: Normocephalic, without  obvious abnormality, atraumatic  Back: no CVA tenderness  Lungs: respirations unlabored, no wheezing  Heart: Regular rate and rhythm, no lower extremity edema noted  Abdomen: Soft, non-tender, non-distended, no masses  Skin: Skin color, texture, turgor normal, no rashes or lesions  Neurologic: no gross deficits  Female GU:   ??? External Genitalia show no irritation or erythema  ??? Bladder is non tender  ??? Vagina has no masses or discharge    Labs:  CBC Lab Results   Component Value Date    WBC 15.9 09/06/2015    RBC 3.52 09/06/2015    HGB 10.5 09/06/2015    HCT 31.6 09/06/2015    MCV 89.7 09/06/2015    MCH 29.8 09/06/2015    MCHC 33.2 09/06/2015    RDW 15.9 09/06/2015    PLT 213 09/06/2015    MPV 8.1 09/06/2015     BMP Lab Results   Component Value Date    NA 138 09/06/2015    K 3.9 09/06/2015    CL 105 09/06/2015    CO2 21 09/06/2015    BUN 7 09/06/2015    CREATININE 0.8 09/06/2015    GLUCOSE 87 09/06/2015    CALCIUM 7.8 09/06/2015       Urinalysis: Lab Results   Component Value Date     COLORU YELLOW 09/05/2015    GLUCOSEU Negative 09/05/2015    BLOODU SMALL 09/05/2015    NITRU POSITIVE 09/05/2015    LEUKOCYTESUR SMALL 09/05/2015       Imaging:  6 mm proximal right ureteral stone, associated smaller stone a    Impression/Plan: obstructing right ureteral stone, UT, leukocytosis  Blood cultures from admission growing gram - bacilli,   - OR for cysto right stent placement  - will need 2 wks of abx prior to f/u procedure for definitive stone management    Vicenta Aly, MD 10/2/20169:06 AM

## 2015-09-06 NOTE — Progress Notes (Signed)
Arouses easily. No complaints. Stable for transfer back to floor. Report called.

## 2015-09-06 NOTE — Brief Op Note (Signed)
Brief Postoperative Note    Heather Sampson  Date of Birth:  [birth year]  [id number]    Pre-operative Diagnosis: right ureteral calc, uti, bacteremia    Post-operative Diagnosis: Same    Procedure: cysto, right stent placement    Anesthesia: MAC    Surgeons/Assistants: Victoriano Lain    Estimated Blood Loss: less than 50     Complications: None    Specimens: Was Not Obtained    Findings: 6x24 stent    Plan;  Cont IV abx, adjust to cultures, will need at least 10 days abx prior to f/u ureteroscopy for stone management    Electronically signed by Vicenta Aly, MD on 09/06/2015 at 9:00 AM

## 2015-09-06 NOTE — Progress Notes (Signed)
Patient to OR via bed at this time. Ticket to ride and consents in chart.

## 2015-09-06 NOTE — Progress Notes (Signed)
1.Verify patient  2.Orientation surroundings  3.Confirms consent  4.Assess anxiety  5.Maintain core temperature 96.8 or above  6.Patient safety call light in reach side rail(s) up.  7. IV infusing without problems.  8.Assess increasing levels of pain.  9.Monitor for nausea.  10.Follow standard of cares.

## 2015-09-06 NOTE — Progress Notes (Signed)
Patient returned from OR via bed. C/O of pain/discomfort to abdomen. PRN medications to be given. Vital signs stable. Call light within reach. Will continue to monitor.

## 2015-09-06 NOTE — Progress Notes (Signed)
Progress Note  Admit Date: 09/05/2015      PCP: No primary care provider on file.     CC: F/U for hydronephrosis    SUBJECTIVE / Interval History:  Patient feels better after procedure, pain improved    Blood c/s +          Allergies  Review of patient's allergies indicates no known allergies.    Medications    Scheduled Meds:  ??? cefTRIAXone (ROCEPHIN) IV  1 g Intravenous Q24H   ??? sodium chloride flush  10 mL Intravenous 2 times per day   ??? enoxaparin  40 mg Subcutaneous Daily   ??? tamsulosin  0.4 mg Oral Daily     Continuous Infusions:  ??? sodium chloride 150 mL/hr at 09/06/15 0325       PRN Meds:  phenazopyridine, HYDROcodone 5 mg - acetaminophen, sodium chloride flush, acetaminophen, magnesium hydroxide, ondansetron, HYDROmorphone, ketorolac    Vitals    TEMPERATURE:  Current - Temp: 99.9 ??F (37.7 ??C); Max - Temp  Avg: 98.5 ??F (36.9 ??C)  Min: 97.2 ??F (36.2 ??C)  Max: 99.9 ??F (37.7 ??C)  RESPIRATIONS RANGE: Resp  Avg: 21.7  Min: 14  Max: 33  PULSE RANGE: Pulse  Avg: 89.2  Min: 75  Max: 97  BLOOD PRESSURE RANGE:  Systolic (24hrs), Avg:112 , Min:98 , Max:135   ; Diastolic (24hrs), Avg:67, Min:57, Max:81    PULSE OXIMETRY RANGE: SpO2  Avg: 96.1 %  Min: 94 %  Max: 100 %  24HR INTAKE/OUTPUT:    Intake/Output Summary (Last 24 hours) at 09/06/15 1303  Last data filed at 09/06/15 1114   Gross per 24 hour   Intake   3370 ml   Output    350 ml   Net   3020 ml       Exam:    Gen: No distress.   Eyes: PERRL. No sclera icterus. No conjunctival injection.   ENT: No discharge. Pharynx clear. External appearance of ears and nose normal.  Neck: Trachea midline. No obvious mass.    Resp: No accessory muscle use. No crackles. No wheezes. No rhonchi. No dullness on percussion.  CV: Regular rate. Regular rhythm. No murmur or rub. No edema.   GI: Non-tender. Non-distended. No hernia.   Skin: Warm, dry, normal texture and turgor. No nodule on exposed extremities.   Lymph: No cervical LAD. No supraclavicular LAD.   M/S: No cyanosis. No  clubbing. No joint deformity.    Neuro: Moves all four extremities. CN 2-12 tested, no defect noted.  Psych: Oriented x 3. No anxiety.  Awake. Alert. Intact judgement and insight.    Data      CBC: Recent Labs      09/05/15   1150  09/06/15   0548   WBC  9.0  15.9*   HGB  12.4  10.5*   HCT  38.6  31.6*   MCV  89.8  89.7   PLT  321  213     BMP: Recent Labs      09/05/15   1150  09/06/15   0548   NA  141  138   K  3.7  3.9   CL  103  105   CO2  22  21   BUN  6*  7   CREATININE  0.6  0.8     POC GLUCOSE:  No results for input(s): POCGLU in the last 72 hours.  LIVER PROFILE:   Recent Labs  09/05/15   1150   AST  17   ALT  8*   LIPASE  23.0   BILITOT  0.6   ALKPHOS  68     PT/INR: No results for input(s): PROTIME, INR in the last 72 hours.  APTT: No results for input(s): APTT in the last 72 hours.  UA:  Recent Labs      09/05/15   1150   COLORU  YELLOW   PHUR  5.5   WBCUA  100*   RBCUA  5-10*   MUCUS  1+*   BACTERIA  4+*   CLARITYU  CLOUDY*   SPECGRAV  1.027   LEUKOCYTESUR  SMALL*   UROBILINOGEN  0.2   BILIRUBINUR  Negative   BLOODU  SMALL*   GLUCOSEU  Negative       Consults:     IP CONSULT TO UROLOGY    ASSESSMENT AND PLAN      Active Problems:    * No active hospital problems. *      Acute R Hydronephrosis sec to obstructing ureteral calculi  - urology consulted  -s/p cystoscopy with ureteral stent placement  - pain control  ??  UTI   -ct ceftriaxone  - c/s gram neg rods    Bacteremia  - ct abd, final c/s pending  -repeat blood c/s  -sec to UTI  - will need 14 days abx    DVT Prophylaxis: lovenox  Diet: DIET GENERAL;  Code Status: Full Code        Discharge plan - tomorrow    The patient and / or the family were informed of the results of any tests, a time was given to answer questions, a plan was proposed and they agreed with plan.    Discussed with consulting physicians, nursing and social work     The note was completed using EMR. Every effort was made to ensure accuracy; however, inadvertent computerized  transcription errors may be present.       Jim Desanctis, MD

## 2015-09-06 NOTE — Progress Notes (Signed)
1.  Patient is identified using name and date of birth.  2.  The patient is free from signs and symptoms of injury.  3.  The patient receives appropriate medication(s), safely administered during the perioperative period.  4.  The patient had wound/tissuue perfusion consistent with or improved from baseline levels established preoperatively.  5.  The patient is at or returning to normothermia at the conclusion of the immediate postoperative period.  6.  The patient's fluid, electrolyte, and acid base balances are consistent with or improved from baseline levels established preoperatively.  7.  The patient's pulmonary function is consistent with or improved from baseline levels established preoperatively.  8.  The patient's cardiovascular status is consistent with or improved from baseline levels established preoperatively.  9.  The patient/caregiver participates in decisions affecting his or her perioperative care.  10.  The patient's care is consistent with the individualized perioperative plan of care.  11.  The patient's right to privacy is maintained.  12.  The patient is the recipient of competent and ethical care within legal standards of practice.  13.  The patient's value system, lifestyle, ethnicity, and culture are considered, respected, and incorporated in the perioperative plan of care.  14.  The patient demonstrates and/or reports adequate pain control throughout the perioperative period.  15.  The patient's neurological status is consistent with or improved from baseline levels established preoperatively.  16.  The patient/caregiver demonstrates knowledge of the expected responses to the operative or invasive procedure.  17.  Patient/caregiver has reduced anxiety.  Interventions - familiarize with environment and equipment.

## 2015-09-06 NOTE — Progress Notes (Signed)
Stable for transfer to floor

## 2015-09-06 NOTE — Progress Notes (Signed)
To pacu. sedate

## 2015-09-06 NOTE — Op Note (Signed)
[facility]             [address] [place], [place]  [address] [phone]                                   OPERATIVE REPORT    PATIENT NAME:  Heather Sampson, Heather Sampson                 DOB:      May 21, 1986  MED REC NO:    [id number]                       ROOM:     [id number]  ACCOUNT NO:    [id number]                       ADMISSION DATE: 09/05/2015  PHYSICIAN:     Lesia Sago. Victoriano Lain, MD               DATE OF PROCEDURE:  09/06/2015    PREOPERATIVE DIAGNOSIS:    1.  Obstructing right ureteral calculus.  2.  UTI.  3.  Bacteremia.      POSTOPERATIVE DIAGNOSIS:    1.  Obstructing right ureteral calculus.  2.  UTI.  3.  Bacteremia.    PROCEDURE PERFORMED:  Cystoscopy, right ureteral stent placement.    SURGEON:  Mcneil Sober, MD.    ANESTHESIA:  MAC.    INDICATION:  This is a 29 year old female who presented with right flank pain,  was found to have several obstructing proximal right ureteral stones.  These  measured up to 6 mm.  Urinalysis was positive for UTI.  She was admitted and  given IV antibiotics and scheduled for a stent placement.  She remained  afebrile, but blood cultures from admission were growing gram negative  bacilli.  She was counseled on management and elected to proceed with cysto  and right stent placement.  She is aware that followup procedures will be  necessary to treat the stones.      OPERATIVE COURSE:  She was brought to the operating room where an anesthetic  was administered.  She was positioned dorsolithotomy and prepped and draped in  sterile fashion.  A time out was performed.  A cystoscope was inserted  atraumatically through the urethra into the bladder.  The urethra was normal.   The bladder was fully examined.  The urine was cloudy consistent with UTI.   There were some findings of cystitis but no suspicious mucosa abnormalities.   At the right ureteral orifice, there did appear to be a stone at the UVJ.  A  sensor wire was used to cannulate  the orifice and advanced atraumatically into  the kidney.  A 6 x 24 stent was placed under fluoroscopic guidance with the  proximal aspect in the kidney.  The guidewire was removed and a good curl was  visualized in the kidney and the bladder.  The bladder was drained.  The  patient was awoken and taken to Recovery in stable condition.  She will be  readmitted to the medical service.  She will need continued IV antibiotics  adjusted to culture results.  She will need at least 10 days of oral  antibiotics prior to followup procedure to manage the stones.        Lesia Sago. Victoriano Lain, MD  D: 09/06/2015 09:04:59  T: 09/06/2015 12:56:59  MRF/nts  Job#: [id number]  Doc#: [id number]

## 2015-09-06 NOTE — Progress Notes (Signed)
Patient awake in bed. A&O. Vital signs stable. C/O of pain to abdomen a this time. Patient recently given PRN Dilaudid. Shift assessment completed and charted. IV infusing. Bed in lowest position. Call light within reach. Will continue to monitor patient.

## 2015-09-06 NOTE — Plan of Care (Signed)
Problem: Safety:  Goal: Free from accidental physical injury  Free from accidental physical injury   Outcome: Ongoing  Non-skid socks provided to patient. Bed in lowest position.  Call light within reach. Environment remains clutter free.     Problem: Pain:  Goal: Patient???s pain/discomfort is manageable  Patient???s pain/discomfort is manageable   Outcome: Ongoing  Patient able to rate pain on a scale of 1 to 10. PRN pain medications given to help control pain.     Problem: Discharge Planning:  Goal: Patients continuum of care needs are met  Patients continuum of care needs are met   Outcome: Ongoing

## 2015-09-07 LAB — CULTURE, URINE: Urine Culture, Routine: >100000

## 2015-09-07 MED ORDER — OXYBUTYNIN CHLORIDE ER 10 MG PO TB24
10 MG | Freq: Every evening | ORAL | Status: DC
Start: 2015-09-07 — End: 2015-09-08
  Administered 2015-09-07: 13:00:00 10 mg via ORAL

## 2015-09-07 MED FILL — OXYBUTYNIN CHLORIDE ER 10 MG PO TB24: 10 MG | ORAL | Qty: 1

## 2015-09-07 MED FILL — PHENAZOPYRIDINE HCL 100 MG PO TABS: 100 MG | ORAL | Qty: 1

## 2015-09-07 MED FILL — HYDROCODONE-ACETAMINOPHEN 5-325 MG PO TABS: 5-325 MG | ORAL | Qty: 1

## 2015-09-07 MED FILL — NORMAL SALINE FLUSH 0.9 % IV SOLN: 0.9 % | INTRAVENOUS | Qty: 10

## 2015-09-07 MED FILL — KETOROLAC TROMETHAMINE 15 MG/ML IJ SOLN: 15 MG/ML | INTRAMUSCULAR | Qty: 1

## 2015-09-07 MED FILL — TAMSULOSIN HCL 0.4 MG PO CAPS: 0.4 MG | ORAL | Qty: 1

## 2015-09-07 MED FILL — LOVENOX 40 MG/0.4ML SC SOLN: 40 MG/0.4ML | SUBCUTANEOUS | Qty: 0.4

## 2015-09-07 MED FILL — CEFTRIAXONE SODIUM 2 G IJ SOLR: 2 g | INTRAMUSCULAR | Qty: 2

## 2015-09-07 NOTE — Plan of Care (Signed)
Problem: Safety:  Goal: Free from accidental physical injury  Free from accidental physical injury   Outcome: Ongoing    Problem: Pain:  Goal: Patient???s pain/discomfort is manageable  Patient???s pain/discomfort is manageable   Outcome: Ongoing  Pain/discomfort being managed with PRN analgesics per MD orders. Pt able to express presence and absence of pain and rate pain appropriately using numerical scale.

## 2015-09-07 NOTE — Plan of Care (Signed)
Problem: Safety:  Goal: Free from accidental physical injury  Free from accidental physical injury   Outcome: Ongoing  Goal: Free from intentional harm  Free from intentional harm   Outcome: Ongoing    Problem: Pain:  Goal: Patient???s pain/discomfort is manageable  Patient???s pain/discomfort is manageable   Outcome: Ongoing    Problem: Discharge Planning:  Goal: Patients continuum of care needs are met  Patients continuum of care needs are met   Outcome: Ongoing

## 2015-09-07 NOTE — Progress Notes (Signed)
Clinical Pharmacy Note  Medication Counseling    Pharmacy initial introduction completed. Discussed pharmacy services with [name].  Explained pharmacy's role in the review of all new medication orders prior to the patient receiving their first dose.    Reviewed new medications started since current hospital admission: Rocephin, Ditropan, Flomax,Pyridium,Lovenox,Toradol,Norco. Indications and side effects were emphasized during counseling.  All medication-related questions addressed.  Patient verbalized understanding.    Should the patient express any additional questions or concerns regarding their medications, please do not hesitate to contact the pharmacy department.

## 2015-09-07 NOTE — Other (Addendum)
Patient Acct Nbr:  [id number]  Primary AUTH/CERT:    Primary Insurance Company Name:   [facility]  Primary Insurance Plan Name:  [facility]  Primary Insurance Group Number:    Primary Insurance Plan Type: [employer]  Primary Insurance Policy Number:  [id number]    Secondary AUTH/CERT:    Secondary Insurance Company Name:   [employer]  Secondary Insurance Plan Name:  SELF PAY  Secondary Insurance Group Number:    Secondary Insurance Plan Type: [employer]  Secondary Insurance Policy Number:  [id number]

## 2015-09-07 NOTE — Progress Notes (Signed)
Pt Name: Heather Sampson  Medical Record Number: [id number]  Date of Birth 1986/10/09   Today's Date: 09/07/2015      Subjective: Complains of severe stent pain on right flank not controlled with meds. Also complains of subjective fever/ sweating overnight. Low grade temp recorded.    Vitals:  Vitals:    09/06/15 1442 09/06/15 1808 09/06/15 2051 09/07/15 0515   BP: 108/65 96/61 101/61 113/72   Pulse: 91 99 93 93   Resp: Temp: 98.4 ??F (36.9 ??C) 99.2 ??F (37.3 ??C) 98.9 ??F (37.2 ??C) 100 ??F (37.8 ??C)   TempSrc: Oral Oral Oral Oral   SpO2: 99% 99% 98% 98%   Weight:    156 lb 8.4 oz (71 kg)   Height:         I/O last 3 completed shifts:  In: 1890 [P.O.:1200; I.V.:690]  Out: 800 [Urine:800]    Exam:  General: Awake, oriented, no acute distress  Abdomen: Soft, TTP RLQ, non-distended, no masses  GU: right flank/ lower abdominal tenderness  Skin: Skin color, texture, turgor normal, no rashes or lesions  Neurologic: no gross deficits    CURRENT MEDICATIONS   Scheduled Meds:  ??? cefTRIAXone (ROCEPHIN) IV  2 g Intravenous Q24H   ??? sodium chloride flush  10 mL Intravenous 2 times per day   ??? enoxaparin  40 mg Subcutaneous Daily   ??? tamsulosin  0.4 mg Oral Daily     Continuous Infusions:   PRN Meds:.phenazopyridine, HYDROcodone 5 mg - acetaminophen, sodium chloride flush, acetaminophen, magnesium hydroxide, ondansetron, HYDROmorphone, ketorolac    LABS     Recent Labs      09/05/15   1150  09/06/15   0548   WBC  9.0  15.9*   HGB  12.4  10.5*   HCT  38.6  31.6*   PLT  321  213   NA  141  138   K  3.7  3.9   CL  103  105   CO2  22  21   BUN  6*  7   CREATININE  0.6  0.8   CALCIUM  9.2  7.8*   AST  17   --    ALT  8*   --    BILITOT  0.6   --      Urine culture shows E coli Resistant to ampicillin and cefazolin  Blood culture 2/2+ for E coli    ASSESSMENT   1. Hospital day # 2  2. Right ureteral stones with obstruction s/p stent placement, now with persistent stent pain  3. E coli UTI on Ceftriaxone  PLAN   1. Add  vesicare for stent pain, continue Norco, toradol  2. Will need full antibiotic treatment prior to stone treatment     Donzetta Matters, MD 09/07/2015 8:40 AM

## 2015-09-07 NOTE — Progress Notes (Signed)
Progress Note  Admit Date: 09/05/2015      PCP: No primary care provider on file.     CC: F/U for hydronephrosis    SUBJECTIVE / Interval History:  Patient feels better . Spiked a temp yesterday.  Repeat blood c/s pending         Allergies  Review of patient's allergies indicates no known allergies.    Medications    Scheduled Meds:  ??? oxybutynin  10 mg Oral Nightly   ??? cefTRIAXone (ROCEPHIN) IV  2 g Intravenous Q24H   ??? sodium chloride flush  10 mL Intravenous 2 times per day   ??? enoxaparin  40 mg Subcutaneous Daily   ??? tamsulosin  0.4 mg Oral Daily     Continuous Infusions:       PRN Meds:  phenazopyridine, HYDROcodone 5 mg - acetaminophen, sodium chloride flush, acetaminophen, magnesium hydroxide, ondansetron, HYDROmorphone, ketorolac    Vitals    TEMPERATURE:  Current - Temp: 100 ??F (37.8 ??C); Max - Temp  Avg: 98.9 ??F (37.2 ??C)  Min: 97.2 ??F (36.2 ??C)  Max: 100 ??F (37.8 ??C)  RESPIRATIONS RANGE: Resp  Avg: 22.8  Min: 14  Max: 33  PULSE RANGE: Pulse  Avg: 92.2  Min: 90  Max: 99  BLOOD PRESSURE RANGE:  Systolic (24hrs), Avg:107 , Min:96 , Max:115   ; Diastolic (24hrs), Avg:65, Min:61, Max:72    PULSE OXIMETRY RANGE: SpO2  Avg: 95.8 %  Min: 94 %  Max: 99 %  24HR INTAKE/OUTPUT:      Intake/Output Summary (Last 24 hours) at 09/07/15 0904  Last data filed at 09/06/15 1830   Gross per 24 hour   Intake   1590 ml   Output    800 ml   Net    790 ml       Exam:    Gen: No distress.   Eyes: PERRL. No sclera icterus. No conjunctival injection.   ENT: No discharge. Pharynx clear. External appearance of ears and nose normal.  Neck: Trachea midline. No obvious mass.    Resp: No accessory muscle use. No crackles. No wheezes. No rhonchi. No dullness on percussion.  CV: Regular rate. Regular rhythm. No murmur or rub. No edema.   GI: Non-tender. Non-distended. No hernia.   Skin: Warm, dry, normal texture and turgor. No nodule on exposed extremities.   Lymph: No cervical LAD. No supraclavicular LAD.   M/S: No cyanosis. No clubbing.  No joint deformity.    Neuro: Moves all four extremities. CN 2-12 tested, no defect noted.  Psych: Oriented x 3. No anxiety.  Awake. Alert. Intact judgement and insight.    Data      CBC:   Recent Labs      09/05/15   1150  09/06/15   0548   WBC  9.0  15.9*   HGB  12.4  10.5*   HCT  38.6  31.6*   MCV  89.8  89.7   PLT  321  213     BMP:   Recent Labs      09/05/15   1150  09/06/15   0548   NA  141  138   K  3.7  3.9   CL  103  105   CO2  22  21   BUN  6*  7   CREATININE  0.6  0.8     POC GLUCOSE:  No results for input(s): POCGLU in the last 72 hours.  LIVER PROFILE:  Recent Labs      09/05/15   1150   AST  17   ALT  8*   LIPASE  23.0   BILITOT  0.6   ALKPHOS  68     PT/INR: No results for input(s): PROTIME, INR in the last 72 hours.  APTT: No results for input(s): APTT in the last 72 hours.  UA:  Recent Labs      09/05/15   1150   COLORU  YELLOW   PHUR  5.5   WBCUA  100*   RBCUA  5-10*   MUCUS  1+*   BACTERIA  4+*   CLARITYU  CLOUDY*   SPECGRAV  1.027   LEUKOCYTESUR  SMALL*   UROBILINOGEN  0.2   BILIRUBINUR  Negative   BLOODU  SMALL*   GLUCOSEU  Negative       Consults:     IP CONSULT TO UROLOGY    ASSESSMENT AND PLAN      Active Problems:    * No active hospital problems. *      Acute R Hydronephrosis sec to obstructing ureteral calculi  - urology consulted  -s/p cystoscopy with ureteral stent placement  - pain control  ??  UTI   -ct ceftriaxone  - c/s E coli    Bacteremia  - ct abx, E coli  final c/s pending  -repeat blood c/s  -sec to UTI  - will need 14 days abx    DVT Prophylaxis: lovenox  Diet: DIET GENERAL;  Code Status: Full Code        Discharge plan - tomorrow    The patient and / or the family were informed of the results of any tests, a time was given to answer questions, a plan was proposed and they agreed with plan.    Discussed with consulting physicians, nursing and social work     The note was completed using EMR. Every effort was made to ensure accuracy; however, inadvertent computerized  transcription errors may be present.       Jim Desanctis, MD

## 2015-09-07 NOTE — Progress Notes (Signed)
Patient woke with night sweats, but is afebrile. PRN Toradol given around 2200 and states the pain comes on when urinating, otherwise able to rest comfortably.

## 2015-09-08 LAB — CULTURE BLOOD #1
Blood Culture, Routine: POSITIVE
Organism: DETECTED — AB

## 2015-09-08 LAB — CULTURE, BLOOD 2: Culture, Blood 2: POSITIVE

## 2015-09-08 MED ORDER — PHENAZOPYRIDINE HCL 100 MG PO TABS
100 MG | ORAL_TABLET | Freq: Three times a day (TID) | ORAL | 0 refills | Status: AC | PRN
Start: 2015-09-08 — End: 2015-09-11

## 2015-09-08 MED ORDER — OXYBUTYNIN CHLORIDE ER 10 MG PO TB24
10 MG | ORAL_TABLET | Freq: Every evening | ORAL | 3 refills | Status: DC
Start: 2015-09-08 — End: 2015-09-28

## 2015-09-08 MED ORDER — HYDROCODONE-ACETAMINOPHEN 5-325 MG PO TABS
5-325 MG | ORAL_TABLET | Freq: Four times a day (QID) | ORAL | 0 refills | Status: AC | PRN
Start: 2015-09-08 — End: 2015-09-13

## 2015-09-08 MED ORDER — PROBIOTIC ACIDOPHILUS PO TABS
ORAL_TABLET | Freq: Every day | ORAL | 0 refills | Status: DC
Start: 2015-09-08 — End: 2015-09-28

## 2015-09-08 MED ORDER — LEVOFLOXACIN 500 MG PO TABS
500 MG | ORAL_TABLET | Freq: Every day | ORAL | 0 refills | Status: AC
Start: 2015-09-08 — End: 2015-09-20

## 2015-09-08 MED FILL — KETOROLAC TROMETHAMINE 15 MG/ML IJ SOLN: 15 MG/ML | INTRAMUSCULAR | Qty: 1

## 2015-09-08 MED FILL — HYDROMORPHONE HCL 1 MG/ML IJ SOLN: 1 MG/ML | INTRAMUSCULAR | Qty: 0.5

## 2015-09-08 MED FILL — TAMSULOSIN HCL 0.4 MG PO CAPS: 0.4 MG | ORAL | Qty: 1

## 2015-09-08 MED FILL — NORMAL SALINE FLUSH 0.9 % IV SOLN: 0.9 % | INTRAVENOUS | Qty: 10

## 2015-09-08 MED FILL — HYDROCODONE-ACETAMINOPHEN 5-325 MG PO TABS: 5-325 MG | ORAL | Qty: 1

## 2015-09-08 MED FILL — PHENAZOPYRIDINE HCL 100 MG PO TABS: 100 MG | ORAL | Qty: 1

## 2015-09-08 MED FILL — CEFTRIAXONE SODIUM 2 G IJ SOLR: 2 g | INTRAMUSCULAR | Qty: 2

## 2015-09-08 MED FILL — LOVENOX 40 MG/0.4ML SC SOLN: 40 MG/0.4ML | SUBCUTANEOUS | Qty: 0.4

## 2015-09-08 NOTE — Progress Notes (Signed)
Patient ambulated off floor with belongings.Electronically signed by Lenis Noon, RN on 09/08/2015 at 3:26 PM

## 2015-09-08 NOTE — Progress Notes (Signed)
Pt complains of pain 10/10 right side and lower pelvic, worse with urination. 15 mg Toradol IVP as per order. Pt denies other needs at this time. Call light in reach. Will continue to monitor.

## 2015-09-08 NOTE — Progress Notes (Signed)
Pt Name: Heather Sampson  Medical Record Number: [id number]  Date of Birth 12/28/1985   Today's Date: 09/08/2015      Subjective: feeling a little better, still having stent pain/ bladder spasms related to voiding.    Vitals:  Vitals:    09/07/15 1003 09/07/15 1324 09/07/15 2217 09/08/15 0658   BP:  99/62 106/62 116/75   Pulse:  78 78 68   Resp:   18 16   Temp: 98.5 ??F (36.9 ??C) 98.5 ??F (36.9 ??C) 98.8 ??F (37.1 ??C) 98.7 ??F (37.1 ??C)   TempSrc: Oral Oral Oral Oral   SpO2:  97% 98% 98%   Weight:    156 lb 12 oz (71.1 kg)   Height:         I/O last 3 completed shifts:  In: 480 [P.O.:480]  Out: 700 [Urine:700]    Exam:  General: Awake, oriented, no acute distress  Abdomen: Soft, non-tender, non-distended, no masses  GU: voiding  Skin: Skin color, texture, turgor normal, no rashes or lesions  Neurologic: no gross deficits    CURRENT MEDICATIONS   Scheduled Meds:  ??? oxybutynin  10 mg Oral Nightly   ??? cefTRIAXone (ROCEPHIN) IV  2 g Intravenous Q24H   ??? sodium chloride flush  10 mL Intravenous 2 times per day   ??? enoxaparin  40 mg Subcutaneous Daily   ??? tamsulosin  0.4 mg Oral Daily     Continuous Infusions:   PRN Meds:.phenazopyridine, HYDROcodone 5 mg - acetaminophen, sodium chloride flush, acetaminophen, magnesium hydroxide, ondansetron, HYDROmorphone, ketorolac    LABS     Recent Labs      09/05/15   1150  09/06/15   0548   WBC  9.0  15.9*   HGB  12.4  10.5*   HCT  38.6  31.6*   PLT  321  213   NA  141  138   K  3.7  3.9   CL  103  105   CO2  22  21   BUN  6*  7   CREATININE  0.6  0.8   CALCIUM  9.2  7.8*   AST  17   --    ALT  8*   --    BILITOT  0.6   --          ASSESSMENT   1. Hospital day # 3 septic right stone s/p stent placemnt  2. Urine/ blood cultures + for [facility]  PLAN   1. Would trasition to oral abx : Bactrim and treat 2 weeks  2. Can be discharged from GU standpoint  3. Stent pain is common: cocktail of medications including : flomax, ditropan, pyridium and po pain meds can help alleviate  4. Will need f/u  ureteroscopy to treat stone, currently has no insurance coverage and will need to be referred to [facility] to manage. If she obtains coverage I will perform f/u procedure.     Vicenta Aly, MD 09/08/2015 9:28 AM

## 2015-09-08 NOTE — Anesthesia Post-Procedure Evaluation (Signed)
Anesthesia Post-op Note    Patient: Heather Sampson  MRN: [id number]  Birthdate: 09-13-1986  Date of evaluation: 09/10/2015  Time:  7:13 AM         [facility] Department of Anesthesiology  Post-Anesthesia Note       Name:  Heather Sampson                                  Age:  29 y.o.  MRN:  [id number]     Last Vitals & Oxygen Saturation:   Visit Vitals   ??? BP 107/65   ??? Pulse 65   ??? Temp 98.5 ??F (36.9 ??C) (Oral)   ??? Resp 16   ??? Ht  (1.651 m)   ??? Wt 156 lb 12 oz (71.1 kg)   ??? LMP 08/12/2015 (Exact Date)   ??? SpO2 98%   ??? BMI 26.08 kg/m2     No data found.      Level of consciousness:  Awake, alert    Respiratory: Respirations easy, no distress. Stable.    Cardiovascular: Hemodynamically stable.    Hydration: Adequate.    PONV: Adequately managed.    Post-op pain: Adequately controlled.    Post-op assessment: Tolerated anesthetic well without complication.    Complications:  None.    Erlinda Hong, MD  September 10, 2015   7:13 AM      Anesthesia Post Evaluation    Erlinda Hong, MD  7:13 AM

## 2015-09-08 NOTE — Plan of Care (Signed)
Problem: Safety:  Goal: Free from accidental physical injury  Free from accidental physical injury   Outcome: Ongoing  Pt free of accidental physical injury. Bed in lowest position, wheels locked, ID band correct and in place, call light within reach.         Problem: Pain:  Goal: Patient???s pain/discomfort is manageable  Patient???s pain/discomfort is manageable   Outcome: Ongoing  Pain/Discomfort is being managed with PRN analgesics per MD orders (See MAR). Patient is able to express and rate pain using numerical scale.

## 2015-09-08 NOTE — Discharge Instructions (Addendum)
Do not drive on narcotic pain medication  Pyridium will cause urine to be orange  Avoid strenuous activities until Doctor says it is okay

## 2015-09-08 NOTE — Plan of Care (Signed)
Problem: Pain:  Goal: Patient???s pain/discomfort is manageable  Patient???s pain/discomfort is manageable   Outcome: Met This Shift  Pain/discomfort being managed with PRN analgesics per MD orders. Pt able to express presence and absence of pain and rate pain appropriately using numerical scale.

## 2015-09-08 NOTE — Discharge Summary (Signed)
Hospital Medicine Discharge Summary      Patient ID: Heather Sampson      Patient's PCP: No primary care provider on file.    Admit Date: 09/05/2015     Discharge Date: 09/08/2015    Admitting Physician: Jim Desanctis, MD    Discharge Physician: Jim Desanctis, MD     Admitted for   Chief Complaint   Patient presents with   ??? Abdominal Pain     RLQ (near ovary) with associated n/v x 2 days       Admitting Diagnosis ACUTE UNILATERAL OBSTRUTIVE UROPATHY    Discharge Diagnoses:     Active Hospital Problems    Diagnosis Date Noted   ??? Acute pyelonephritis [N10] 09/08/2015   ??? Bacteremia [R78.81] 09/08/2015         The patient was seen and examined on day of discharge and this discharge summary is in conjunction with any daily progress note from day of discharge.    Hospital Course:   Patient was admitted with Acute R Hydronephrosis sec to obstructing ureteral calculi. She underwent cystoscopy with ureteral stent placement. She also had EColi  bacteremia sec to UTI. Repeat blood c/s was negative. She needs 14 days of abx . Need FU for stent removal.     Acute R Hydronephrosis sec to obstructing ureteral calculi  - urology consulted  -s/p cystoscopy with ureteral stent placement  - pain control  ??  UTI   -ct ceftriaxone  - c/s E coli    Bacteremia  - ct abx, E coli  final c/s pending  -repeat blood c/s negative   -sec to UTI  - will need 14 days abx      Consults:     IP CONSULT TO UROLOGY        Disposition: home    Discharged Condition: Stable    Code Status: Full Code    Activity: activity as tolerated    Diet: regular diet    Follow Up: Primary Care Physician in one week    PCP to Follow up on   Needs to have ureteral stent removed           Labs: For convenience and continuity at follow-up the following most recent labs are provided:    CBC:   Lab Results   Component Value Date    WBC 15.9 09/06/2015    HGB 10.5 09/06/2015    HCT 31.6 09/06/2015    PLT 213 09/06/2015       RENAL:   Lab Results    Component Value Date    NA 138 09/06/2015    K 3.9 09/06/2015    CL 105 09/06/2015    CO2 21 09/06/2015    BUN 7 09/06/2015    CREATININE 0.8 09/06/2015           Discharge Medications:    [name], [name]   Home Medication Instructions ZOX:[id number]    Printed on:09/08/15 1439   Medication Information                      HYDROcodone-acetaminophen (NORCO) 5-325 MG per tablet  Take 1 tablet by mouth every 6 hours as needed for Pain             Lactobacillus (PROBIOTIC ACIDOPHILUS) TABS  Take 1 tablet by mouth daily             levofloxacin (LEVAQUIN) 500 MG tablet  Take 1 tablet by mouth daily for 12  days             oxybutynin (DITROPAN-XL) 10 MG extended release tablet  Take 1 tablet by mouth nightly             phenazopyridine (PYRIDIUM) 100 MG tablet  Take 1 tablet by mouth 3 times daily as needed (urinary discomfort)                    Time Spent on discharge is more than 45 minutes in the examination, evaluation, counseling and review of medications and discharge plan.      Signed:  Jim Desanctis, MD   09/08/2015    The note was completed using EMR. Every effort was made to ensure accuracy; however, inadvertent computerized transcription errors may be present.     Thank you No primary care provider on file. for the opportunity to be involved in this patient's care. If you have any questions or concerns please feel free to contact me at [phone].

## 2015-09-08 NOTE — Progress Notes (Signed)
Progress Note  Admit Date: 09/05/2015      PCP: No primary care provider on file.     CC: F/U for hydronephrosis    SUBJECTIVE / Interval History:  Patient feels better . Has abd pain while passing urine        Allergies  Review of patient's allergies indicates no known allergies.    Medications    Scheduled Meds:  ??? oxybutynin  10 mg Oral Nightly   ??? cefTRIAXone (ROCEPHIN) IV  2 g Intravenous Q24H   ??? sodium chloride flush  10 mL Intravenous 2 times per day   ??? enoxaparin  40 mg Subcutaneous Daily   ??? tamsulosin  0.4 mg Oral Daily     Continuous Infusions:       PRN Meds:  phenazopyridine, HYDROcodone 5 mg - acetaminophen, sodium chloride flush, acetaminophen, magnesium hydroxide, ondansetron, HYDROmorphone, ketorolac    Vitals    TEMPERATURE:  Current - Temp: 98.7 ??F (37.1 ??C); Max - Temp  Avg: 98.6 ??F (37 ??C)  Min: 98.5 ??F (36.9 ??C)  Max: 98.8 ??F (37.1 ??C)  RESPIRATIONS RANGE: Resp  Avg: 17  Min: 16  Max: 18  PULSE RANGE: Pulse  Avg: 74.7  Min: 68  Max: 78  BLOOD PRESSURE RANGE:  Systolic (24hrs), Avg:107 , Min:99 , Max:116   ; Diastolic (24hrs), Avg:66, Min:62, Max:75    PULSE OXIMETRY RANGE: SpO2  Avg: 97.7 %  Min: 97 %  Max: 98 %  24HR INTAKE/OUTPUT:      Intake/Output Summary (Last 24 hours) at 09/08/15 0921  Last data filed at 09/08/15 0837   Gross per 24 hour   Intake    490 ml   Output    700 ml   Net   -210 ml       Exam:    Gen: No distress.   Eyes: PERRL. No sclera icterus. No conjunctival injection.   ENT: No discharge. Pharynx clear. External appearance of ears and nose normal.  Neck: Trachea midline. No obvious mass.    Resp: No accessory muscle use. No crackles. No wheezes. No rhonchi. No dullness on percussion.  CV: Regular rate. Regular rhythm. No murmur or rub. No edema.   GI: Non-tender. Non-distended. No hernia.   Skin: Warm, dry, normal texture and turgor. No nodule on exposed extremities.   Lymph: No cervical LAD. No supraclavicular LAD.   M/S: No cyanosis. No clubbing. No joint deformity.     Neuro: Moves all four extremities. CN 2-12 tested, no defect noted.  Psych: Oriented x 3. No anxiety.  Awake. Alert. Intact judgement and insight.    Data      CBC:   Recent Labs      09/05/15   1150  09/06/15   0548   WBC  9.0  15.9*   HGB  12.4  10.5*   HCT  38.6  31.6*   MCV  89.8  89.7   PLT  321  213     BMP:   Recent Labs      09/05/15   1150  09/06/15   0548   NA  141  138   K  3.7  3.9   CL  103  105   CO2  22  21   BUN  6*  7   CREATININE  0.6  0.8     POC GLUCOSE:  No results for input(s): POCGLU in the last 72 hours.  LIVER PROFILE:   Recent Labs  09/05/15   1150   AST  17   ALT  8*   LIPASE  23.0   BILITOT  0.6   ALKPHOS  68     PT/INR: No results for input(s): PROTIME, INR in the last 72 hours.  APTT: No results for input(s): APTT in the last 72 hours.  UA:  Recent Labs      09/05/15   1150   COLORU  YELLOW   PHUR  5.5   WBCUA  100*   RBCUA  5-10*   MUCUS  1+*   BACTERIA  4+*   CLARITYU  CLOUDY*   SPECGRAV  1.027   LEUKOCYTESUR  SMALL*   UROBILINOGEN  0.2   BILIRUBINUR  Negative   BLOODU  SMALL*   GLUCOSEU  Negative       Consults:     IP CONSULT TO UROLOGY    ASSESSMENT AND PLAN      Active Problems:    * No active hospital problems. *    Patient was admitted with Acute R Hydronephrosis sec to obstructing ureteral calculi. She underwent cystoscopy with ureteral stent placement. She also had EColi  bacteremia sec to UTI. Repeat blood c/s was negative. She needs 14 days of abx . Need FU for stent removal.     Acute R Hydronephrosis sec to obstructing ureteral calculi  - urology consulted  -s/p cystoscopy with ureteral stent placement  - pain control  ??  UTI   -ct ceftriaxone  - c/s E coli    Bacteremia  - ct abx, E coli  final c/s pending  -repeat blood c/s negative   -sec to UTI  - will need 14 days abx    DVT Prophylaxis: lovenox  Diet: DIET GENERAL;  Code Status: Full Code        Discharge plan - today     The patient and / or the family were informed of the results of any tests, a time was  given to answer questions, a plan was proposed and they agreed with plan.    Discussed with consulting physicians, nursing and social work     The note was completed using EMR. Every effort was made to ensure accuracy; however, inadvertent computerized transcription errors may be present.       Jim Desanctis, MD

## 2015-09-08 NOTE — Discharge Instructions (Signed)
Good nutrition is important when healing from an illness, injury, or surgery. Follow any nutrition recommendations given to you during the hospital stay. If you are instructed to take an oral nutrition supplement at home, you can take it with meals, in-between meals, and/or before bedtime. These supplements can be purchased at most local grocery stores, pharmacies, and chain super-stores. If you need help in covering the cost of your medication or oral supplement, visit www.RxAssist.org. If you have any questions about your diet or nutrition, call the hospital and ask for the dietitian.       Your nutrition orders at the time of discharge were:  DIET GENERAL;.

## 2015-09-11 LAB — CULTURE BLOOD #1: Blood Culture, Routine: NO GROWTH

## 2015-09-28 ENCOUNTER — Encounter: Admit: 2015-09-29 | Primary: Internal Medicine

## 2015-09-28 ENCOUNTER — Encounter: Primary: Internal Medicine

## 2015-09-28 ENCOUNTER — Inpatient Hospital Stay: Admit: 2015-09-28 | Discharge: 2015-09-29 | Discharge status: Against Medical Advice

## 2015-09-28 DIAGNOSIS — N23 Unspecified renal colic: Secondary | ICD-10-CM

## 2015-09-28 DIAGNOSIS — R109 Unspecified abdominal pain: Secondary | ICD-10-CM

## 2015-09-28 LAB — DIFFERENTIAL
Basophils Absolute: 30 /uL (ref 0–200)
Basophils Relative: 0.4 % (ref 0.0–1.0)
Eosinophils Absolute: 248 /uL (ref 15–500)
Eosinophils Relative: 3.3 % (ref 0.0–8.0)
Lymphocytes Absolute: 3855 /uL (ref 850–3900)
Lymphocytes Relative: 51.4 % (ref 15.0–45.0)
Monocytes Absolute: 450 /uL (ref 200–950)
Monocytes Relative: 6 % (ref 0.0–12.0)
Neutrophils Absolute: 2918 /uL (ref 1500–7800)
Neutrophils Relative: 38.9 % (ref 40.0–80.0)
nRBC: 0 /100 WBC (ref 0–0)

## 2015-09-28 LAB — BASIC METABOLIC PANEL
Anion Gap: 6 mmol/L (ref 3–16)
BUN: 12 mg/dL (ref 7–25)
CO2: 28 mmol/L (ref 21–33)
Calcium: 9.6 mg/dL (ref 8.6–10.3)
Chloride: 105 mmol/L (ref 98–110)
Creatinine: 0.7 mg/dL (ref 0.60–1.30)
Glucose: 93 mg/dL (ref 70–100)
Osmolality, Calculated: 287 mOsm/kg (ref 278–305)
Potassium: 4 mmol/L (ref 3.5–5.3)
Sodium: 139 mmol/L (ref 133–146)
eGFR AA CKD-EPI: >90 See note.
eGFR NONAA CKD-EPI: >90 See note.

## 2015-09-28 LAB — CBC
Hematocrit: 34 % (ref 35.0–45.0)
Hemoglobin: 11.4 g/dL (ref 11.7–15.5)
MCH: 29.6 pg (ref 27.0–33.0)
MCHC: 33.5 g/dL (ref 32.0–36.0)
MCV: 88.3 fL (ref 80.0–100.0)
MPV: 8.1 fL (ref 7.5–11.5)
Platelets: 514 10*3/uL (ref 140–400)
RBC: 3.86 10*6/uL (ref 3.80–5.10)
RDW: 15.7 % (ref 11.0–15.0)
WBC: 7.5 10*3/uL (ref 3.8–10.8)

## 2015-09-28 LAB — MAGNESIUM: Magnesium: 1.9 mg/dL (ref 1.5–2.5)

## 2015-09-28 LAB — HEPATIC FUNCTION PANEL
ALT: 7 U/L (ref 7–52)
AST: 16 U/L (ref 13–39)
Albumin: 4.5 g/dL (ref 3.5–5.7)
Alkaline Phosphatase: 54 U/L (ref 36–125)
Bilirubin, Direct: 0.1 mg/dL (ref 0.0–0.4)
Bilirubin, Indirect: 0.4 mg/dL (ref 0.0–1.1)
Total Bilirubin: 0.5 mg/dL (ref 0.0–1.5)
Total Protein: 7.7 g/dL (ref 6.4–8.9)

## 2015-09-28 NOTE — Unmapped (Signed)
Pt states she had a urethral stent placed 09/08/15, and she states that she is having some blood in her urine

## 2015-09-28 NOTE — ED Triage Notes (Signed)
Patient arrived via private vehicle with friend.  C/O RLQ pain 10/10 sharp.  S/p stent placement 10/2 for kidney stones.  No relief from pain since procedure.  Bleeding of unknown origin x 3 days, heavy bleeding noted today.

## 2015-09-28 NOTE — ED Notes (Signed)
Pt resting quietly in bed, no c/c of pain or nausea.     Macon LargeLori Bunch, RN  09/28/15 [phone]

## 2015-09-28 NOTE — ED Provider Notes (Signed)
Triage Chief Complaint:   Post-op Problem (kidney stent placed 09/06/2015, pain since)    HOPI:  Heather Sampson is a 29 y.o. female that presents to the emergency Department with complaints of lower abdominal pain which is been going on since she had a stent placed on her right side.  Patient states that she was seen here and had the stent placed in the beginning of the month, has been having pain has been increasing ever since.  Patient was told that she needs to follow-up at [facility] for follow-up, and has been able to get seen there either.  Patient states that she is taking multiple doses of Motrin daily, with minimal resolution of her symptoms.  Patient denies fevers or chills, or nausea or vomiting.  Patient arrives stating that she is spent the last 10 hours in the emergency department at [facility] waiting to be seen.    ROS:  At least 10 systems reviewed and otherwise acutely negative except as in the HOPI.    Past Medical History   Diagnosis Date   ??? Bladder infection    ??? Kidney stone      Past Surgical History   Procedure Laterality Date   ??? Ureter stent placement Right      No family history on file.  Social History     Social History   ??? Marital status: Single     Spouse name: N/A   ??? Number of children: N/A   ??? Years of education: N/A     Occupational History   ??? Not on file.     Social History Main Topics   ??? Smoking status: Current Every Day Smoker     Types: Cigars   ??? Smokeless tobacco: Not on file      Comment: 1 black and mild/day   ??? Alcohol use Yes      Comment: 1 drink/month   ??? Drug use: Yes     Special: Marijuana   ??? Sexual activity: Not Currently     Other Topics Concern   ??? Not on file     Social History Narrative     No current facility-administered medications for this encounter.      Current Outpatient Prescriptions   Medication Sig Dispense Refill   ??? ketorolac (TORADOL) 10 MG tablet Take 1 tablet by mouth 3 times daily 12 tablet 0   ??? oxyCODONE-acetaminophen (PERCOCET)  5-325 MG per tablet Take 1-2 tablets by mouth every 4 hours as needed for Pain 15 tablet 0   ??? tamsulosin (FLOMAX) 0.4 MG capsule Take 1 capsule by mouth daily for 10 doses 10 capsule 0   ??? ibuprofen (ADVIL;MOTRIN) 600 MG tablet Take 600 mg by mouth every 6 hours as needed for Pain     ??? HYDROcodone-acetaminophen (NORCO) 5-325 MG per tablet Take 1 tablet by mouth every 6 hours as needed for Pain       No Known Allergies    Nursing Notes Reviewed    Physical Exam:  ED Triage Vitals   BP Temp Temp Source Pulse Resp SpO2 Height Weight   09/28/15 2121 09/28/15 2121 09/28/15 2121 09/28/15 2121 09/28/15 2121 09/28/15 2121 -- 09/28/15 2121   144/75 97 ??F (36.1 ??C) Oral 91 18 100 %  145 lb 15.1 oz (66.2 kg)     GENERAL APPEARANCE: Awake and alert. Cooperative. No acute distress.   HEAD: Normocephalic. Atraumatic.  EYES: EOM's grossly intact. Sclera anicteric.  ENT: Mucous membranes are moist.  Tolerates saliva. No trismus.  NECK: Supple. No meningismus. Trachea midline.  HEART: RRR.  LUNGS: Respirations unlabored. CTAB  ABDOMEN: Soft. Non-tender. No guarding or rebound.  EXTREMITIES: No acute deformities.  SKIN: Warm and dry.  NEUROLOGICAL: No gross facial drooping. Moves all 4 extremities spontaneously.    Physical Exam    I have reviewed and interpreted all of the currently available lab results from this visit (if applicable):  Results for orders placed or performed during the hospital encounter of 09/28/15   CBC Auto Differential   Result Value Ref Range    WBC 6.6 4.0 - 11.0 K/uL    RBC 3.82 (L) 4.00 - 5.20 M/uL    Hemoglobin 11.2 (L) 12.0 - 16.0 g/dL    Hematocrit 16.134.1 (L) 36.0 - 48.0 %    MCV 89.3 80.0 - 100.0 fL    MCH 29.2 26.0 - 34.0 pg    MCHC 32.7 31.0 - 36.0 g/dL    RDW 09.616.3 (H) 04.512.4 - 15.4 %    Platelets 518 (H) 135 - 450 K/uL    MPV 7.6 5.0 - 10.5 fL    Neutrophils Relative 38.9 %    Lymphocytes Relative 52.0 %    Monocytes Relative 5.3 %    Eosinophils Relative Percent 3.2 %    Basophils Relative 0.6 %     Neutrophils Absolute 2.6 1.7 - 7.7 K/uL    Lymphocytes Absolute 3.4 1.0 - 5.1 K/uL    Monocytes Absolute 0.3 0.0 - 1.3 K/uL    Eosinophils Absolute 0.2 0.0 - 0.6 K/uL    Basophils Absolute 0.0 0.0 - 0.2 K/uL   Basic Metabolic Panel   Result Value Ref Range    Sodium 139 136 - 145 mmol/L    Potassium 3.7 3.5 - 5.1 mmol/L    Chloride 101 99 - 110 mmol/L    CO2 26 21 - 32 mmol/L    Anion Gap 12 3 - 16    Glucose 169 (H) 70 - 99 mg/dL    BUN 12 7 - 20 mg/dL    CREATININE 0.6 0.6 - 1.1 mg/dL    GFR Non-African American >60 >60    GFR African American >60 >60    Calcium 9.2 8.3 - 10.6 mg/dL   Urine reflex to culture   Result Value Ref Range    Color, UA RED (A) Straw/Yellow    Clarity, UA CLOUDY (A) Clear    Glucose, Ur Negative Negative mg/dL    Bilirubin Urine Negative Negative    Ketones, Urine Negative Negative mg/dL    Specific Gravity, UA >=1.030 1.005 - 1.030    Blood, Urine LARGE (A) Negative    pH, UA 6.0 5.0 - 8.0    Protein, UA 100 (A) Negative mg/dL    Urobilinogen, Urine 1.0 <2.0 E.U./dL    Nitrite, Urine Negative Negative    Leukocyte Esterase, Urine MODERATE (A) Negative    Microscopic Examination YES     Urine Reflex to Culture Yes     Urine Type Not Specified    Pregnancy, Urine   Result Value Ref Range    HCG(Urine) Pregnancy Test Negative Detects HCG level >20 MIU/mL   Microscopic Urinalysis   Result Value Ref Range    Urinalysis Comments see below     WBC, UA 108 (H) 0 - 5 /HPF    RBC, UA >900 (H) 0 - 4 /HPF    Epi Cells 7 (H) 0 - 5 /HPF  Radiographs (if obtained):   The following radiograph was interpreted by myself in the absence of a radiologist:   Radiologist's Report Reviewed:  CT Abd and Pelvis WO Contrast   Final Result   Mild right hydronephrosis, with right nephroureteral stent in place.   Numerous punctate calculi are seen within the right ureter along the course   of the right nephroureteral stent.             Procedures    MDM:  After my initial evaluation, I did decide to do a  CT scan of the patient, throughout the possibility of either pyelonephritis, or worsening hydronephrosis.  I reviewed the patient's old records, noted that she was bacteremic secondary to UTI that time, and I do think that we need to make sure there is no worsening of her condition of present time.  Patient has no fevers or chills, or nausea or vomiting.  The patient herself seems be very frustrated that she cannot get continued care for her stent.  Patient's blood work shows no signs of any elevations of her white blood cell count, or signs of kidney dysfunction.  Urinalysis demonstrates March RBCs with some slight WBCs, but no signs of any bacteria.  There is also no nitrates in the urine.  At this point I don't feel that constitutes a UTI, so I am not going to start antibiotics.  Patient had a CT done which shows mild right hydronephrosis, but the ureteral stent does appear to be in place.  I did speak with Dr. Helmut Muster over the phone, and discuss case with him.  He states that he would like to provide help for the patient as well, and is recommended that she call his office tomorrow, so he can help to make arranged to get her taken care of.  I did relate this to the patient, and also spoke with her at length about her test results.  I will provide her with pain medications.  Patient had been given Toradol and I wanted here, with good resolution of her pain.    Clinical Impression:  1. Renal colic      (Please note that portions of this note may have been completed with a voice recognition program. Efforts were made to edit the dictations but occasionally words are mis-transcribed.)    San Jetty, MD       San Jetty, MD  09/29/15 [phone]

## 2015-09-29 ENCOUNTER — Inpatient Hospital Stay
Admit: 2015-09-29 | Discharge: 2015-09-29 | Disposition: A | Discharge status: Home / Self Care | Attending: Emergency Medicine

## 2015-09-29 LAB — CBC WITH AUTO DIFFERENTIAL
Basophils %: 0.6 %
Basophils Absolute: 0 10*3/uL (ref 0.0–0.2)
Eosinophils %: 3.2 %
Eosinophils Absolute: 0.2 10*3/uL (ref 0.0–0.6)
Hematocrit: 34.1 % — ABNORMAL LOW (ref 36.0–48.0)
Hemoglobin: 11.2 g/dL — ABNORMAL LOW (ref 12.0–16.0)
Lymphocytes %: 52 %
Lymphocytes Absolute: 3.4 10*3/uL (ref 1.0–5.1)
MCH: 29.2 pg (ref 26.0–34.0)
MCHC: 32.7 g/dL (ref 31.0–36.0)
MCV: 89.3 fL (ref 80.0–100.0)
MPV: 7.6 fL (ref 5.0–10.5)
Monocytes %: 5.3 %
Monocytes Absolute: 0.3 10*3/uL (ref 0.0–1.3)
Neutrophils %: 38.9 %
Neutrophils Absolute: 2.6 10*3/uL (ref 1.7–7.7)
Platelets: 518 10*3/uL — ABNORMAL HIGH (ref 135–450)
RBC: 3.82 M/uL — ABNORMAL LOW (ref 4.00–5.20)
RDW: 16.3 % — ABNORMAL HIGH (ref 12.4–15.4)
WBC: 6.6 10*3/uL (ref 4.0–11.0)

## 2015-09-29 LAB — MICROSCOPIC URINALYSIS
Epithelial Cells, UA: 7 /HPF — ABNORMAL HIGH (ref 0–5)
RBC, UA: >900 /HPF — ABNORMAL HIGH (ref 0–4)
WBC, UA: 108 /HPF — ABNORMAL HIGH (ref 0–5)

## 2015-09-29 LAB — URINALYSIS WITH REFLEX TO CULTURE
Bilirubin Urine: NEGATIVE
Glucose, Ur: NEGATIVE mg/dL
Ketones, Urine: NEGATIVE mg/dL
Nitrite, Urine: NEGATIVE
Protein, UA: 100 mg/dL — AB
Specific Gravity, UA: >=1.03 (ref 1.005–1.030)
Urobilinogen, Urine: 1 E.U./dL (ref <2.0)
pH, UA: 6 (ref 5.0–8.0)

## 2015-09-29 LAB — BASIC METABOLIC PANEL
Anion Gap: 12 (ref 3–16)
BUN: 12 mg/dL (ref 7–20)
CO2: 26 mmol/L (ref 21–32)
Calcium: 9.2 mg/dL (ref 8.3–10.6)
Chloride: 101 mmol/L (ref 99–110)
Creatinine: 0.6 mg/dL (ref 0.6–1.1)
GFR African American: >60 (ref >60)
GFR Non-African American: >60 (ref >60)
Glucose: 169 mg/dL — ABNORMAL HIGH (ref 70–99)
Potassium: 3.7 mmol/L (ref 3.5–5.1)
Sodium: 139 mmol/L (ref 136–145)

## 2015-09-29 LAB — PREGNANCY, URINE: HCG(Urine) Pregnancy Test: NEGATIVE

## 2015-09-29 MED ORDER — OXYCODONE-ACETAMINOPHEN 5-325 MG PO TABS
5-325 MG | ORAL_TABLET | ORAL | 0 refills | Status: AC | PRN
Start: 2015-09-29 — End: 2015-10-06

## 2015-09-29 MED ORDER — HYDROMORPHONE 0.5MG/0.5ML IJ SOLN
1 MG/ML | Freq: Once | Status: AC
Start: 2015-09-29 — End: 2015-09-28
  Administered 2015-09-29: 02:00:00 0.5 mg via INTRAVENOUS

## 2015-09-29 MED ORDER — KETOROLAC TROMETHAMINE 10 MG PO TABS
10 MG | ORAL_TABLET | Freq: Three times a day (TID) | ORAL | 0 refills | Status: DC
Start: 2015-09-29 — End: 2017-01-06

## 2015-09-29 MED ORDER — SODIUM CHLORIDE 0.9 % IV BOLUS
0.9 % | Freq: Once | INTRAVENOUS | Status: AC
Start: 2015-09-29 — End: 2015-09-28
  Administered 2015-09-29: 02:00:00 1000 mL via INTRAVENOUS

## 2015-09-29 MED ORDER — OXYCODONE-ACETAMINOPHEN 5-325 MG PO TABS
5-325 MG | Freq: Once | ORAL | Status: AC
Start: 2015-09-29 — End: 2015-09-29
  Administered 2015-09-29: 04:00:00 1 via ORAL

## 2015-09-29 MED ORDER — TAMSULOSIN HCL 0.4 MG PO CAPS
0.4 MG | ORAL_CAPSULE | Freq: Every day | ORAL | 0 refills | Status: DC
Start: 2015-09-29 — End: 2017-01-06

## 2015-09-29 MED ORDER — ONDANSETRON HCL 4 MG/2ML IJ SOLN
4 MG/2ML | Freq: Once | INTRAMUSCULAR | Status: AC
Start: 2015-09-29 — End: 2015-09-28
  Administered 2015-09-29: 02:00:00 4 mg via INTRAVENOUS

## 2015-09-29 MED ORDER — KETOROLAC TROMETHAMINE 30 MG/ML IJ SOLN
30 MG/ML | Freq: Once | INTRAMUSCULAR | Status: AC
Start: 2015-09-29 — End: 2015-09-28
  Administered 2015-09-29: 02:00:00 30 mg via INTRAVENOUS

## 2015-09-29 MED ORDER — TAMSULOSIN HCL 0.4 MG PO CAPS
0.4 MG | Freq: Once | ORAL | Status: AC
Start: 2015-09-29 — End: 2015-09-29
  Administered 2015-09-29: 04:00:00 0.4 mg via ORAL

## 2015-09-29 MED FILL — HYDROMORPHONE HCL 1 MG/ML IJ SOLN: 1 MG/ML | INTRAMUSCULAR | Qty: 0.5

## 2015-09-29 MED FILL — OXYCODONE-ACETAMINOPHEN 5-325 MG PO TABS: 5-325 MG | ORAL | Qty: 1

## 2015-09-29 MED FILL — ONDANSETRON HCL 4 MG/2ML IJ SOLN: 4 MG/2ML | INTRAMUSCULAR | Qty: 2

## 2015-09-29 MED FILL — TAMSULOSIN HCL 0.4 MG PO CAPS: 0.4 MG | ORAL | Qty: 1

## 2015-09-29 MED FILL — SODIUM CHLORIDE 0.9 % IV SOLN: 0.9 % | INTRAVENOUS | Qty: 1000

## 2015-09-29 MED FILL — KETOROLAC TROMETHAMINE 30 MG/ML IJ SOLN: 30 MG/ML | INTRAMUSCULAR | Qty: 1

## 2015-09-29 NOTE — ED Notes (Signed)
Patient awaiting friend to arrive for discharge.  PIV removed.     Macon LargeLori Bunch, RN  09/29/15 [phone]

## 2015-09-29 NOTE — ED Notes (Signed)
Patient discharged home with friend.  Instructions reviewed, prescriptions and work release note given, follow up appts discussed.  Denies questions at this time.  Verbalized understanding. Ambulatory at time of discharge with steady gait.       Macon LargeLori Bunch, RN  09/29/15 [phone]

## 2015-09-30 LAB — CULTURE, URINE: Urine Culture, Routine: <50000

## 2015-10-02 ENCOUNTER — Ambulatory Visit: Admit: 2015-10-02

## 2015-10-02 DIAGNOSIS — N132 Hydronephrosis with renal and ureteral calculous obstruction: Secondary | ICD-10-CM

## 2015-10-02 MED ORDER — phenazopyridine (PYRIDIUM) 100 MG tablet
100 | ORAL_TABLET | Freq: Three times a day (TID) | ORAL | Status: AC | PRN
Start: 2015-10-02 — End: 2015-10-05

## 2015-10-02 MED ORDER — senna-docusate (SENNA-S) 8.6-50 mg per tablet
8.6-50 | ORAL_TABLET | Freq: Every day | ORAL | Status: AC | PRN
Start: 2015-10-02 — End: 2018-04-27

## 2015-10-02 MED ORDER — oxybutynin (DITROPAN) 5 MG tablet
5 | ORAL_TABLET | Freq: Two times a day (BID) | ORAL | Status: AC | PRN
Start: 2015-10-02 — End: 2018-04-27

## 2015-10-02 NOTE — Unmapped (Addendum)
Urology Clinic - Medical Student Note    Chief Complaint: right side pain for one month    History of Present Illness  Sophia Pittman is a 29 y.o. female with a one month history of right nephrolithiasis and hydronephrosis, s/p stent placement, who presents to Urology clinic today for evaluation of persistent right flank pain since stent placement. She first noted pain in her right flank one month ago along with fever, chills, and painful urination. She was initially seen at [facility] for these complaints, at which time CT showed right hydronephrosis and a cluster of right kidney stones in lower pole of the kidney. An emergency stent was placed the next day to relieved hydronephrosis. She was also diagnosed with a UTI and treated with antibiotics and symptomatically for pain. Although her fever and chills have resolved, she continues to have 10/10 right flank pain and has had to modify her ambulation (ie, walking down stairs backwards) to prevent exacerbation. She returned to [facility] ED four days ago for re-evaluation of her pain.  A second CT was taken at that time showed mild right-sided hydronephrosis with progression of stones along her right stent.  She was prescribed Flomax, Percocet, and tamsulosin at that time and told to follow up and hydrate aggressively. She presents to the urology clinic today, however she has not brought a CD copy of her recent CT scan. She endorses constipation since starting the Percocet and some left knee pain (which she attributes to positioning in bed 2/2 to flank pain). She denies fever, chills and headache.    Review of Systems  ROS negative for HEENT, respiratory, cardiovascular, gastrointestinal, endocrine, hematology, neurologic, psychiatric, skin, and musculo-skeletal systems except as otherwise documented in HPI.    Medical / Surgical History  Patient had a miscarriage one year ago, but reports no other medical problems.    Patient reports no previous  surgeries.    Medications  Outpatient Encounter Prescriptions as of 10/02/2015   Medication Sig Dispense Refill   ??? ketorolac (TORADOL) 10 mg tablet Take 10 mg by mouth 3 times a day.     ??? oxyCODONE-acetaminophen (PERCOCET) 5-325 mg per tablet Take 1 tablet by mouth every 4 hours as needed for Pain.     ??? tamsulosin (FLOMAX) 0.4 mg Cp24 Take 0.4 mg by mouth daily.       No facility-administered encounter medications on file as of 10/02/2015.       Allergies  Seasonal Allergies  Lactose Intolerant  NDKA    Social / Family History  Patient reports that she quit smoking about 3 weeks ago. She does not have any smokeless tobacco history on file. She reports that she drinks alcohol. She reports that she does not use illicit drugs. She is sexually active and does not use any form of contraception.    Patient's family history is not on file.    The following portions of the patient's history were reviewed and updated as appropriate: past medical history, past surgical history, current medications, allergies, past social history, past family history, problem list.    Physical exam  Vital signs and nursing notes reviewed.  Blood pressure 120/79, pulse 84, temperature 98.9 ??F (37.2 ??C), temperature source Oral, height 5' 5 (1.651 m), weight 146 lb (66.225 kg), last menstrual period 09/25/2015.    Constitutional: Patient is pleasant but endorses significant pain and discomfort  HEENT:normocephalic, PERRL  Cardiovascular: RRR, no murmurs noted  Pulmonary: CTAB, no wheezes or rhonchi  Abdominal: soft, NT/ND;  mild right CVA tenderness  Musculoskeletal: MAES; no LE edema  Neurological: face symmetric, A&O x4    Imaging  09/28/2015 abdominal CT from [facility]  Impresion:  Mild right hydronephrosis, with right nephroureteral stent in place.  Numerous punctate calculi are seen within the right ureter along the course  of the right nephroureteral stent.    Labs  No results found for: COLORU, CLARITYU, PH, PROTEINUA, PHUR, LABSPEC,  GLUCOSEU, BLOODU, LEUKOCYTESUR, NITRITE, BILIRUBINUR, UROBILINOGEN, RBCUA, WBCUA, BACTERIA, AMORPHOUS, CRYSTAL, CASTS  CREATININE   Date Value Ref Range Status   09/28/2015 0.70 0.60 - 1.30 mg/dL Final       Assessment  Sophia Pittman is a 29 y.o. female with right sided pain following ureteral stent placement and nephrolithiasis.    Plan  Plan for ureteroscopic stone and right ureteral stent removal pending access to patient's 09/28/2015 CT showing stent and stones in right ureter.    No further CT indicated at this time for young patient with two recent abdominal CTs.    Continue treatment medically with ketorolac, percocet, and Flomax until ureterscopy.    Prescribe stool softener for constipation, likely opiate-induced.  I am supervising the urology clinic and supervised this visit and I agree with the note  Name: Jasper Loser  MS3  ________________________________________________________________________    Resident Addendum  Patient seen and examined with medical student. Agree with assessment and plan. Additional notes include:    Right nephrolithiasis.  Stent placed at [facility] earlier this month  Will have patient obtain copy of CT scan and bring CD to [facility] office for review. Further plan to be determined based on CT, but will likely require ureteroscopy  Prescribed flomax, ditropan for stent-related pain and Senna-S for constipation  Will notify patient with plan once imaging received.    Medical Decision Making  The following items were considered in medical decision making:  Discussion of patient care with other providers  Review / order other diagnostic tests/interventions    Roland Earl, MD

## 2015-10-02 NOTE — Unmapped (Signed)
Please obtain a CD copy of your CT scan from [facility]. Bring it into our clinic (at [facility]) for review.    Stone-prevention diet   - hydration with >2L water/day  - limit sodium intake <2300 mg/day  - limit animal protein < 1g/kg/day  - limit oxalate-rich foods like chocolate, tea, spinach

## 2015-10-02 NOTE — Unmapped (Signed)
Urine obtained bladder scan 0 ml

## 2015-10-02 NOTE — Unmapped (Signed)
Reviewed discharge instructions. Patient verbalizes understanding and has no questions.

## 2015-10-14 ENCOUNTER — Inpatient Hospital Stay: Admit: 2015-10-14

## 2015-10-14 NOTE — Telephone Encounter (Signed)
Returning Dr. Deirdre Peer call.

## 2015-10-15 NOTE — Unmapped (Signed)
I spoke with patient about the plan. Waiting on OR date.

## 2015-10-15 NOTE — Unmapped (Signed)
Spoke with patient, Advised surgery is scheduled for 12/8 at 1:00pm at [facility]. Pt will arrive to 2nd flr SDS at 11am , NPO after midnight. Will mail written surgery instructions. Pt expressed understanding.

## 2015-10-15 NOTE — Telephone Encounter (Signed)
Needs to schedule a appointment.  Patient states Dr. Jeanice Lim told her to call him back

## 2015-10-15 NOTE — Unmapped (Signed)
LM for patient to return call, left direct line.

## 2015-11-09 NOTE — Unmapped (Signed)
Pre-op instructions, arrival time of 1100, and call back number left on voicemail. Told her she could take her morning meds with sips of water only morning of surgery.

## 2015-11-11 MED FILL — CEFAZOLIN 1 GRAM SOLUTION FOR INJECTION: 1 1 g | INTRAMUSCULAR | Qty: 1000

## 2015-11-12 ENCOUNTER — Ambulatory Visit: Admit: 2015-11-13

## 2015-11-12 DIAGNOSIS — F489 Nonpsychotic mental disorder, unspecified: Secondary | ICD-10-CM

## 2015-11-12 LAB — POCT URINE HCG BY VISUAL COLOR COMPARISON TESTS: Preg Test, POC, Ur: NEGATIVE

## 2015-11-12 MED ORDER — fentaNYL (SUBLIMAZE) injection 25 mcg
50 | INTRAMUSCULAR | Status: AC | PRN
Start: 2015-11-12 — End: 2015-11-12
  Administered 2015-11-12: 20:00:00 25 ug via INTRAVENOUS

## 2015-11-12 MED ORDER — lactated ringers infusion
INTRAVENOUS | Status: AC | PRN
Start: 2015-11-12 — End: 2015-11-12
  Administered 2015-11-12 (×2): via INTRAVENOUS

## 2015-11-12 MED ORDER — meperidine (PF) (DEMEROL) 25 mg/mL Syrg 12.5 mg
25 | INTRAMUSCULAR | Status: AC | PRN
Start: 2015-11-12 — End: 2015-11-12

## 2015-11-12 MED ORDER — oxybutynin (DITROPAN) 5 MG tablet
5 | ORAL_TABLET | Freq: Three times a day (TID) | ORAL | Status: AC | PRN
Start: 2015-11-12 — End: ?

## 2015-11-12 MED ORDER — HYDROmorphone (DILAUDID) injection Syrg 0.4 mg
1 | INTRAMUSCULAR | Status: AC | PRN
Start: 2015-11-12 — End: 2015-11-12
  Administered 2015-11-12: 20:00:00 0.4 mg via INTRAVENOUS

## 2015-11-12 MED ORDER — HYDROmorphone (DILAUDID) injection Syrg 0.6 mg
1 | INTRAMUSCULAR | Status: AC | PRN
Start: 2015-11-12 — End: 2015-11-12
  Administered 2015-11-12: 19:00:00 0.6 mg via INTRAVENOUS

## 2015-11-12 MED ORDER — oxyCODONE (ROXICODONE) immediate release tablet 10 mg
5 | ORAL | Status: AC | PRN
Start: 2015-11-12 — End: 2015-11-12
  Administered 2015-11-12: 19:00:00 10 mg via ORAL

## 2015-11-12 MED ORDER — acetaminophen (TYLENOL) tablet 975 mg
325 | ORAL | Status: AC | PRN
Start: 2015-11-12 — End: 2015-11-12
  Administered 2015-11-12: 17:00:00 975 mg via ORAL

## 2015-11-12 MED ORDER — ceFAZolin (ANCEF) 1 g in sodium chloride 0.9% 100 mL ADDaptor IVPB
INTRAVENOUS | Status: AC | PRN
Start: 2015-11-12 — End: 2015-11-12
  Administered 2015-11-12: 18:00:00 1 g via INTRAVENOUS

## 2015-11-12 MED ORDER — fentaNYL (SUBLIMAZE) injection 12.5 mcg
50 | INTRAMUSCULAR | Status: AC | PRN
Start: 2015-11-12 — End: 2015-11-12

## 2015-11-12 MED ORDER — senna-docusate (SENNA-S) 8.6-50 mg per tablet
8.6-50 | ORAL_TABLET | Freq: Two times a day (BID) | ORAL | Status: AC | PRN
Start: 2015-11-12 — End: 2018-04-27

## 2015-11-12 MED ORDER — midazolam (PF) (VERSED) injection
1 | INTRAMUSCULAR | Status: AC | PRN
Start: 2015-11-12 — End: 2015-11-12
  Administered 2015-11-12: 17:00:00 2 via INTRAVENOUS

## 2015-11-12 MED ORDER — dexamethasone (DECADRON) injection 4 mg
4 | Freq: Once | INTRAMUSCULAR | Status: AC
Start: 2015-11-12 — End: 2015-11-12

## 2015-11-12 MED ORDER — peppermint oil liquid 1 mL
Status: AC | PRN
Start: 2015-11-12 — End: 2015-11-12

## 2015-11-12 MED ORDER — ondansetron (ZOFRAN) 4 mg/2 mL injection
4 | INTRAMUSCULAR | Status: AC | PRN
Start: 2015-11-12 — End: 2015-11-12
  Administered 2015-11-12: 19:00:00 4 via INTRAVENOUS

## 2015-11-12 MED ORDER — propofol 10 mg/ml (DIPRIVAN) injection
10 | INTRAVENOUS | Status: AC | PRN
Start: 2015-11-12 — End: 2015-11-12
  Administered 2015-11-12: 18:00:00 150 via INTRAVENOUS
  Administered 2015-11-12: 18:00:00 50 via INTRAVENOUS

## 2015-11-12 MED ORDER — lidocaine (XYLOCAINE) 20 mg/mL (2 %) injection 1 mL
20 | Freq: Once | INTRAMUSCULAR | Status: AC | PRN
Start: 2015-11-12 — End: 2015-11-12

## 2015-11-12 MED ORDER — ondansetron (ZOFRAN) 4 mg/2 mL injection 4 mg
4 | Freq: Three times a day (TID) | INTRAMUSCULAR | Status: AC | PRN
Start: 2015-11-12 — End: 2015-11-12

## 2015-11-12 MED ORDER — OMNIPAQUE 300 mg/mL (iohexol)
300 | INTRAVENOUS | Status: AC | PRN
Start: 2015-11-12 — End: 2015-11-12

## 2015-11-12 MED ORDER — promethazine (PHENERGAN) injection 6.25 mg
25 | Freq: Four times a day (QID) | INTRAMUSCULAR | Status: AC | PRN
Start: 2015-11-12 — End: 2015-11-12

## 2015-11-12 MED ORDER — neostigmine methylsulfate (PROSTIGMIN) IV solution
1 | INTRAVENOUS | Status: AC | PRN
Start: 2015-11-12 — End: 2015-11-12
  Administered 2015-11-12: 19:00:00 4 via INTRAVENOUS

## 2015-11-12 MED ORDER — lidocaine (PF) 20 mg/mL (2 %) Soln
20 | INTRAVENOUS | Status: AC | PRN
Start: 2015-11-12 — End: 2015-11-12
  Administered 2015-11-12: 18:00:00 100 via INTRAVENOUS

## 2015-11-12 MED ORDER — HYDROmorphone (DILAUDID) injection Syrg 0.2 mg
1 | INTRAMUSCULAR | Status: AC | PRN
Start: 2015-11-12 — End: 2015-11-12

## 2015-11-12 MED ORDER — fentaNYL (SUBLIMAZE) 50 mcg/mL injection
50 | INTRAMUSCULAR | Status: AC
Start: 2015-11-12 — End: 2015-11-12
  Administered 2015-11-12: 20:00:00 50 via INTRAVENOUS

## 2015-11-12 MED ORDER — sodium chloride, irrigation 0.9 % irrigation
0.9 | Status: AC | PRN
Start: 2015-11-12 — End: 2015-11-12
  Administered 2015-11-12: 18:00:00 500

## 2015-11-12 MED ORDER — oxyCODONE (ROXICODONE) immediate release tablet 5 mg
5 | ORAL | Status: AC | PRN
Start: 2015-11-12 — End: 2015-11-12

## 2015-11-12 MED ORDER — naloxone (NARCAN) injection 0.04 mg
0.4 | INTRAMUSCULAR | Status: AC | PRN
Start: 2015-11-12 — End: 2015-11-12

## 2015-11-12 MED ORDER — fentaNYL (SUBLIMAZE) injection 50 mcg
50 | INTRAMUSCULAR | Status: AC | PRN
Start: 2015-11-12 — End: 2015-11-12

## 2015-11-12 MED ORDER — rocuronium (ZEMURON) injection
10 | INTRAVENOUS | Status: AC | PRN
Start: 2015-11-12 — End: 2015-11-12
  Administered 2015-11-12: 18:00:00 10 via INTRAVENOUS
  Administered 2015-11-12: 18:00:00 30 via INTRAVENOUS

## 2015-11-12 MED ORDER — fentaNYL (SUBLIMAZE) injection
50 | INTRAMUSCULAR | Status: AC | PRN
Start: 2015-11-12 — End: 2015-11-12
  Administered 2015-11-12 (×3): 50 via INTRAVENOUS

## 2015-11-12 MED ORDER — oxyCODONE (ROXICODONE) 5 MG immediate release tablet
5 | ORAL_TABLET | ORAL | 0.00 refills | 6.00000 days | Status: AC | PRN
Start: 2015-11-12 — End: 2018-04-27

## 2015-11-12 MED ORDER — glycopyrrolate (ROBINUL) injection
0.2 | INTRAMUSCULAR | Status: AC | PRN
Start: 2015-11-12 — End: 2015-11-12
  Administered 2015-11-12: 19:00:00 0.8 via INTRAVENOUS

## 2015-11-12 MED FILL — HYDROMORPHONE 1 MG/ML INJECTION SYRINGE: 1 1 mg/mL | INTRAMUSCULAR | Qty: 1

## 2015-11-12 MED FILL — FENTANYL (PF) 50 MCG/ML INJECTION SOLUTION: 50 50 mcg/mL | INTRAMUSCULAR | Qty: 2

## 2015-11-12 MED FILL — OXYCODONE 5 MG TABLET: 5 5 MG | ORAL | Qty: 2

## 2015-11-12 MED FILL — CEFAZOLIN 1 GRAM SOLUTION FOR INJECTION: 1 1 g | INTRAMUSCULAR | Qty: 1000

## 2015-11-12 MED FILL — TYLENOL 325 MG TABLET: 325 325 mg | ORAL | Qty: 3

## 2015-11-12 NOTE — Unmapped (Signed)
UROLOGY H&P NOTE    Patient: Sophia Pittman  Admit Date: 11/12/2015  Location: PeriOp/PeriOp    Chief Complaint: right nephrolithiasis    HPI: Sophia Pittman is a 29 y.o. female with right nephrolithiasis who had a ureteral stent placed at an outside hospital and presents today for ureteroscopy.  She has had some stent related discomfort but otherwise has been in her usual state of health and denies fevers, chills, n/v, shortness of air, or chest pain.    Medical History:  History reviewed. No pertinent past medical history.    History reviewed. No pertinent past surgical history.    Medications:   Outpatient Meds:  Current Discharge Medication List      CONTINUE these medications which have NOT CHANGED    Details   ketorolac (TORADOL) 10 mg tablet Take 10 mg by mouth 3 times a day.      oxybutynin (DITROPAN) 5 MG tablet Take 1 tablet (5 mg total) by mouth 2 times a day as needed (bladder spasms or stent-related pain).  Qty: 30 tablet, Refills: 2      oxyCODONE-acetaminophen (PERCOCET) 5-325 mg per tablet Take 1 tablet by mouth every 4 hours as needed for Pain.      senna-docusate (SENNA-S) 8.6-50 mg per tablet Take 1 tablet by mouth daily as needed for Constipation.  Qty: 20 tablet, Refills: 0      tamsulosin (FLOMAX) 0.4 mg Cp24 Take 0.4 mg by mouth daily.             Inpatient Meds:  Scheduled:  ??? dexamethasone  4 mg Intravenous Once     Continuous:   BJY:NWGNFAOZ **OR** fentaNYL **OR** fentaNYL, HYDROmorphone **OR** HYDROmorphone **OR** HYDROmorphone, lidocaine, naloxone, OMNIPAQUE 300 mg/mL, ondansetron, oxyCODONE **OR** oxyCODONE, peppermint oil, promethazine, sodium chloride, irrigation    Allergies: No Known Allergies    SH:   Social History   Substance Use Topics   ??? Smoking status: Former Smoker     Quit date: 09/09/2015   ??? Smokeless tobacco: Not on file   ??? Alcohol Use: Yes      Comment: occaasionally       FH: Noncontributory.    ROS: As per HPI. Otherwise negative.    Objective:  Filed Vitals:     11/12/15 1202   BP: 119/72   Pulse: 71   Temp: 97.5 ??F (36.4 ??C)   Resp: 22   SpO2: 100%        Physical Examination:  Gen: No apparent distress, alert and oriented x3  HEENT: Normocephalic, EOMI, mucous membranes pink and moist  CV: RRR  Chest: No respiratory distress  Abd: Soft, non-tender, non-distended   Ext: Warm and well perfused  Neuro: No focal deficits  GU: no flank tenderness to palpation    Labs:  No results for input(s): WBC, HGB, HCT, PLT in the last 72 hours.  No results for input(s): NA, K, CL, CO2, BUN, CREATININE, GLUCOSE, CALCIUM, MG, PHOS in the last 72 hours.  No results for input(s): INR, PROTIME in the last 72 hours.  No results found for: PSA    No results found for: COLORU, CLARITYU, PH, PROTEINUA, PHUR, LABSPEC, GLUCOSEU, BLOODU, LEUKOCYTESUR, NITRITE, BILIRUBINUR, UROBILINOGEN, RBCUA, WBCUA, BACTERIA, AMORPHOUS, CRYSTAL, CASTS    Imaging:   X-ray Comparison Images    10/14/2015  Images associated with this accession number were presented to [place] for comparison to an examination performed here.     X-ray Comparison Images    10/14/2015  Images associated with  this accession number were presented to [place] for comparison to an examination performed here.       Assessment:  Sophia Pittman is a 29 y.o. female with right nephrolithiasis.    Plan:  OR for ureteroscopy and laser lithotripsy.  Ancef on call to OR.    Stellan Vick [name], MD  11/12/2015

## 2015-11-12 NOTE — Unmapped (Signed)
CYSTOSCOPY W/ RIGHT URETEROSCOPY W/ STONE BASKETING, RIGHT URETER STENT REMOVAL  Procedure Note    Sophia Pittman  11/12/2015      Pre-op Diagnosis: Right nephrolithiasis [N20.0]       Post-op Diagnosis: Same    Procedure(s):  CYSTOSCOPY W/ RIGHT URETEROSCOPY W/ STONE BASKETING, RIGHT URETER STENT REMOVAL      Surgeon(s):  Judd Lien, MD    Anesthesia: General    Staff:   Circulator: Hildred Alamin, RN  Relief Circulator: Joie Bimler, RN  Scrub Person: Bayard Hugger, CST  Resident: Stefan Church, MD; Onalee Hua, MD    Estimated Blood Loss: Minimal                 Specimens:   ID Type Source Tests Collected by Time Destination   A : Renal Calculi Stone Kidney Right SURGICAL PATHOLOGY EXAM Judd Lien, MD 11/12/2015  1:02 PM               Drains:             There were no complications unless listed below.  Discharge home.  Follow up in 4-6 weeks.      [name] [name]     Date: 11/12/2015  Time: 1:29 PM

## 2015-11-12 NOTE — Unmapped (Signed)
INTRA-OP POST BRIEFING NOTE: Jalicia Roszak      Specimens:   Specimens     ID Source Type Tests Collected By Collected At Frozen? Attributes Order ID Breast Spec Formalin Marked as Sent    A Kidney Right Stone - SURGICAL PATHOLOGY EXAM Judd Lien, MD 11/12/15 1302  Fresh [id number]            Prior to leaving the room: Nurse confirmed name of procedure, completion of instrument, sponge & needle counts, reads specimen labels aloud including patient name and addresses any equipment issues? Nurse confirmed wound class. Nurse to surgeon and anesthesia: What are key concerns for recovery and management of the patient?  Yes      Blood products stored at appropriate temperatures prior to return to blood bank (if applicable)? N/A      Other Comments:     Signed: Joie Bimler    Date: 11/12/2015    Time: 1:30 PM

## 2015-11-12 NOTE — Unmapped (Signed)
OPERATIVE REPORT    Date of Operation: 11/12/2015    Preoperative Diagnosis:   1. Right nephrolithiasis    Postoperative Diagnosis:   1. Same    Procedure:    1. Cystoscopy  2. Right ureteroscopy, rigid and flexible  3. Stone manipulation with basket retrieval of stone  4. Right stent removal  5. Diagnostic fluoroscopy less than one hour    Surgeon:   Judd Lien, MD    Assistants:   Onalee Hua, MD  Laroy Apple, MD    Anesthesia: General endotracheal anesthesia    Indications: Sophia Pittman is a 29 y.o. female with right nephrolithiasis.  She had a ureteral stent placed an [facility]  She presents today for definitive stone management. Informed consent was obtained and the risks, benefits, and details of the procedure were explained to the patient who elected to proceed.    Details of Procedure:  The patient was brought to the operating room and placed on the operating room table in the supine position. A routine timeout was performed, confirming consent, procedure, patient, and preoperative antibiotics. Anesthesia was induced, and the patient was intubated without issue. Repositioned in dorsal lithotomy position and prepped and draped in usual sterile fashion.    A 22-Fr cystoscope was passed into the bladder without issue. A thorough cystoscopic evaluation was performed using a 30-degree lens. The stent was identified a gradually removed under fluoroscopic guidance with the stent grasper. A 0.035 Glidewire was passed into the stent and advanced into the renal pelvis. The stent was fully removed and Glidewire secured into place.    Next, the semirigid ureteroscope was passed into the bladder next to the the Glidewire. . The semirigid ureteroscope was advanced through the ureter and there were several stones (approximately 7-8) in the distal and mid ureter which were able to be basketed and removed with a 1.9-Fr zero-tipped nitinol basket. All large stone fragments were removed from the ureter  and rigid ureteroscope removed. No stone fragments were seen. A second 0.035 inch Glidewire was passed through the scope and advanced to the renal pelvis and scope removed in a push-pull manner.    Next, a ureteral access sheath was advanced over the second wire to the level of the renal pelvis. The wire and obturator were removed with working sheath left in place. A flexible ureteroscope was then advanced through the ureteral access sheath. The upper, mid, and lower pole were assessed and there was no additional stone fragment in the kidney. The 200 micron laser fiber was advanced through the scope and stone identified and fragmented. A 1.9-Fr zero-tipped nitinol basket was used to retrieve the stone fragments. Once all stone was removed, a thorough endoscopic evaluation of the upper, middle, and lower poles was performed and there were no residual stone fragments. The ureteral access sheath and scope were slowly removed to assess for any stones in the ureter and none were seen.    The patient's bladder was subsequently drained and the cystoscope removed.    The patient tolerated the procedure with no complications.  All instrument and sponge counts were correct.  The patient was extubated without issue and transferred to PACU in stable condition.    Dr. Judd Lien, MD, the attending surgeon, was present for the critical parts of the procedure and directed all clinical decision making.    Findings:   7-8 ureteral stone  No stone in the kidney    Estimated Blood Loss: Minimal    Drains: None  Specimens: Stone specimen for analysis    Complications: None           Disposition:   Patient will be discharged home.  Follow up in 4-6 weeks in Urology Clinic.            Sophia Pittman [name], MD

## 2015-11-12 NOTE — Unmapped (Signed)
[facility]  DEPARTMENT OF ANESTHESIOLOGY  PRE-PROCEDURAL EVALUATION    Sophia Pittman is a 29 y.o. year old female presenting for:    Procedure(s):  CYSTOSCOPY W/ RIGHT URETEROSCOPY W/ LASER LITHOTRIPSY, STENT REMOVAL VERSUS EXCHANGE    Surgeon:   Judd Lien, MD    Chief Complaint     Right nephrolithiasis [N20.0]    Review of Systems     Anesthesia Evaluation    Patient summary reviewed.       No history of anesthetic complications   I have reviewed the History and Physical Exam, any relevant changes are noted in the anesthesia pre-operative evaluation.      Cardiovascular:    Exercise tolerance: good    (-) pacemaker, hypertension, pulmonary hypertension, valvular problems/murmurs, past MI, CAD, cardiomyopathy, CABG/stent, dysrhythmias, angina, CHF, orthopnea, PND.    Neuro/Muscoloskeletal/Psych:      (-) seizures, neuromuscular disease, TIA, CVA, headaches, psychiatric history, arthritis, back problems.     Pulmonary:      (-) no pneumonia, COPD, asthma, shortness of breath, recent URI, sleep apnea, no active tuberculosis.       GI/Hepatic/Renal:      No bowel prep.  (-) no hiatal hernia, GERD, PUD, hepatitis, liver disease, renal disease, no difficulty swallowing, no end stage liver disease.    Endo/Other:    (+) anemia.      (-) diabetes mellitus, hypothyroidism, hyperthyroidism, no thrombocytopenia, no bleeding disorder. Outpatient opioid use      Past Medical History     History reviewed. No pertinent past medical history.    Past Surgical History     History reviewed. No pertinent past surgical history.    Family History     History reviewed. No pertinent family history.    Social History     Social History     Social History   ??? Marital Status: Single     Spouse Name: N/A   ??? Number of Children: N/A   ??? Years of Education: N/A     Occupational History   ??? Not on file.     Social History Main Topics   ??? Smoking status: Former Smoker     Quit date: 09/09/2015   ??? Smokeless tobacco: Not on file   ??? Alcohol  Use: Yes      Comment: occaasionally   ??? Drug Use: No   ??? Sexual Activity: Not on file     Other Topics Concern   ??? Not on file     Social History Narrative       Medications     Allergies:  No Known Allergies    Home Meds:  Prior to Admission medications as of 11/12/15 1109   Medication Sig Taking?   ketorolac (TORADOL) 10 mg tablet Take 10 mg by mouth 3 times a day.    oxybutynin (DITROPAN) 5 MG tablet Take 1 tablet (5 mg total) by mouth 2 times a day as needed (bladder spasms or stent-related pain).    oxyCODONE-acetaminophen (PERCOCET) 5-325 mg per tablet Take 1 tablet by mouth every 4 hours as needed for Pain.    senna-docusate (SENNA-S) 8.6-50 mg per tablet Take 1 tablet by mouth daily as needed for Constipation.    tamsulosin (FLOMAX) 0.4 mg Cp24 Take 0.4 mg by mouth daily.        Inpatient Meds:  Scheduled:   Continuous:     PRN:     Vital Signs     Wt Readings from Last 3  Encounters:   10/02/15 146 lb (66.225 kg)     Ht Readings from Last 3 Encounters:   10/02/15 5' 5 (1.651 m)     Temp Readings from Last 3 Encounters:   10/02/15 98.9 ??F (37.2 ??C) Oral   09/28/15 98.4 ??F (36.9 ??C) Oral   05/14/15 98.5 ??F (36.9 ??C) Oral     BP Readings from Last 3 Encounters:   10/02/15 120/79   09/28/15 118/61   05/14/15 114/67     Pulse Readings from Last 3 Encounters:   10/02/15 84   09/28/15 66   05/14/15 63     SpO2 Readings from Last 3 Encounters:   09/28/15 99%   05/14/15 97%       Physical Exam     Airway:     Mallampati: II  TM distance: > = 3 FB  Neck ROM: full  (-) no facial hair, neck not short      Dental:        Pulmonary:   - normal exam    Breath sounds clear to auscultation.       Cardiovascular:  - normal exam   Rhythm: regular  Rate: normal    Neuro/Musculoskeletal/Psych:  - normal neurological exam.   Mental status: oriented to person, place and time.          Abdominal:    - normal exam  Obese.    Current OB Status:       Other Findings:        Laboratory Data     Lab Results   Component Value Date     WBC 7.5 09/28/2015    HGB 11.4* 09/28/2015    HCT 34.0* 09/28/2015    MCV 88.3 09/28/2015    PLT 514* 09/28/2015       No results found for: [facility]    Lab Results   Component Value Date    GLUCOSE 93 09/28/2015    BUN 12 09/28/2015    CO2 28 09/28/2015    CREATININE 0.70 09/28/2015    K 4.0 09/28/2015    NA 139 09/28/2015    CL 105 09/28/2015    CALCIUM 9.6 09/28/2015    ALBUMIN 4.5 09/28/2015    PROT 7.7 09/28/2015    ALKPHOS 54 09/28/2015    ALT 7 09/28/2015    AST 16 09/28/2015    BILITOT 0.5 09/28/2015       No results found for: PTT, INR    No results found for: PREGTESTUR, PREGSERUM, HCG, HCGQUANT    Anesthesia Plan     ASA 2     Anesthesia Type:  general LMA.   (IV opioids for intraoperative and postoperative pain control.  Standard ASA monitors.  Peripheral IV x 1.  Adjuncts for pain and nausea as needed.  Risks, benefits and options discussed and the patient is comfortable moving forward with our anesthetic plan.  PACU for recovery.  )  Rapid sequence induction.    Anesthetic plan and risks discussed with patient.    Plan, alternatives, and risks of anesthesia, including death, have been explained to and discussed with the patient/legal guardian.  By my assessment, the patient/legal guardian understands and agrees.  Scenario presented in detail.  Questions answered.    Blood products not discussed.  Plan discussed with attending and resident.

## 2015-11-12 NOTE — Unmapped (Addendum)
Will arrange follow up in the Urology Clinic in the [facility] on the 7th floor, in approximately 4-6 weeks.  Our schedulers will call you to arrange the appointment.    Activity:    Activity as tolerated, no limitations, walk or exercise daily    Return to work:   May return to work as tolerated    Diet:   Resume home diet, drink plenty of water    Other Instructions:   Take medications as prescribed. No driving while taking narcotic pain medications.    Some blood in urine can be expected after procedure    Go to the ER or call the Urology Resident on call at [phone] if you experience any of the following:  - Worsening pain, nausea, or vomiting not relieved by medications  - Temperature greater than 101.5 F or fever/chills  - Chest pain, shortness of breath, persistent dizziness, swelling in one or both legs  - Inability to urinate  Instructions Following General Anesthesia    You were given medicines that made you unconscious while a procedure was performed. For as long as 24 hours following this procedure, you may have:  ??? Dizziness, weakness, or drowsiness  ??? Sore throat or hoarseness. ??? Nausea or vomiting     For the first 24 hours following anesthesia:  ?? Have a responsible person with you at all times.  ?? Do not participate in any activities where you could become injured for the next 24 hours, or until you feel normal again.  ?? Do not drive a car or operate heavy machinery.  ?? Do not drink alcohol, take sleeping pills, or other medications that cause drowsiness.  ?? Do not sign important papers or make important decisions.  ?? Only take medicine that has been prescribed by your caregiver.  For pain, only take over-the-counter or prescribed medicines for pain, discomfort, or fever as directed by your caregiver.  ?? If you wear a CPAP/BiPAP device, please wear it anytime that you are resting or feel tired.  ?? You may resume a normal diet unless otherwise directed.  Vomiting may occur if you eat too soon.   When you can drink without vomiting, try water, juice, or soup.  Try solid foods if you feel little or no nausea.  ?? If you have questions or problems that seem related to anesthesia, call the hospital [phone]) and ask for the anesthesiologist on call.    SEEK IMMEDIATE MEDICAL CARE IF:   ??? You develop a rash.  ??? You have chest pain.  ??? You continue to feel sick to your stomach (nauseated) or throw up (vomit). ??? You have difficulty breathing.  ??? You develop any allergic reactions.  ??? You are unable to urinate.       ??

## 2015-11-12 NOTE — Unmapped (Signed)
Anesthesia Post Note    Patient: Sophia Pittman    Procedure(s) Performed: Procedure(s):  CYSTOSCOPY W/ RIGHT URETEROSCOPY W/ STONE BASKETING, RIGHT URETER STENT REMOVAL    Anesthesia type: general LMA    Patient location: PACU    Post pain: Adequate analgesia    Post assessment: no apparent anesthetic complications, tolerated procedure well and no evidence of recall    Last Vitals:   Filed Vitals:    11/12/15 1410 11/12/15 1425 11/12/15 1440 11/12/15 1455   BP: 120/73 125/77 116/81 119/61   Pulse: 73 78 81 69   Temp:    98.1 ??F (36.7 ??C)   TempSrc:    Temporal   Resp: 14 19 25 17    Height:       Weight:       SpO2:  100% 100% 100%        Post vital signs: stable    Level of consciousness: awake, alert , oriented and responds to stimulation    Complications: None

## 2015-11-12 NOTE — Unmapped (Signed)
Anesthesia Transfer of Care Note    Patient: Sophia Pittman  Procedure(s) Performed: Procedure(s):  CYSTOSCOPY W/ RIGHT URETEROSCOPY W/ STONE BASKETING, RIGHT URETER STENT REMOVAL    Patient location: PACU    Anesthesia type: general LMA    Airway Device on Arrival to PACU/ICU: Nasal Cannula    IV Access: Peripheral    Monitors Recommended to be Used During PACU/ICU: Standard Monitors    Outstanding Issues to Address: None    Level of Consciousness: awake    Post vital signs:    Filed Vitals:    11/12/15 1344   BP: 121/66   Pulse: 96   Temp: 97.3 ??F (36.3 ??C)   Resp: 14   SpO2: 100%       Complications: None      Date 11/11/15 0700 - 11/12/15 0659(Not Admitted) 11/12/15 0700 - 11/13/15 0659   Shift 0700-1459 1500-2259 2300-0659 24 Hour Total 0700-1459 1500-2259 2300-0659 24 Hour Total   I  N  T  A  K  E   I.V.     1000  (15.2)   1000  (15.2)      Volume (mL) (lactated ringers infusion)     1000   1000    Shift Total  (mL/kg)     1000  (15.2)   1000  (15.2)   O  U  T  P  U  T   Shift Total  (mL/kg)           Weight (kg)     65.8 65.8 65.8 65.8

## 2015-11-12 NOTE — Unmapped (Signed)
Pt admitted to pacu bay 20, bedside report per anesthesia.   Atalia Litzinger S Arlan Birks

## 2015-11-13 LAB — KIDNEY STONE ANALYSIS (RENAL CANICULI)
Ca Oxalate,Dihydrate: 10 %
Ca Oxalate,Monohydr.: 80 %
Kidney Stone Ca Phos: 10 %
Stone Weight: 350 mg

## 2015-12-20 NOTE — Unmapped (Deleted)
Chief complaint;  History of ureteroscopy.    History of Present Illness:    30 year old female status post ureteric calculi.      Review of Systems   Constitutional: Negative for fever, chills and fatigue.   HENT: Negative for congestion.    Eyes: Negative for visual disturbance.   Respiratory: Negative for cough, shortness of breath, wheezing and stridor.    Cardiovascular: Negative for chest pain and palpitations.   Gastrointestinal: Negative for nausea, vomiting, diarrhea, constipation, abdominal distention and rectal pain.   Genitourinary: Negative for dysuria, urgency, genital sores and menstrual problem.   Musculoskeletal: Negative for myalgias, back pain and neck stiffness.   Skin: Negative for pallor.   Neurological: Negative for dizziness, weakness and numbness.   Hematological: Negative for adenopathy.   Psychiatric/Behavioral: Negative for confusion.       Allergies  Review of patient's allergies indicates no known allergies.    Medications  Outpatient Encounter Prescriptions as of 12/23/2015   Medication Sig Dispense Refill   ??? ketorolac (TORADOL) 10 mg tablet Take 10 mg by mouth 3 times a day.     ??? oxybutynin (DITROPAN) 5 MG tablet Take 1 tablet (5 mg total) by mouth 2 times a day as needed (bladder spasms or stent-related pain). 30 tablet 2   ??? oxybutynin (DITROPAN) 5 MG tablet Take 1 tablet (5 mg total) by mouth 3 times a day as needed (Bladder spasms). 21 tablet 0   ??? oxyCODONE (ROXICODONE) 5 MG immediate release tablet Take 1 tablet (5 mg total) by mouth every 4 hours as needed for Pain. 20 tablet 0   ??? oxyCODONE-acetaminophen (PERCOCET) 5-325 mg per tablet Take 1 tablet by mouth every 4 hours as needed for Pain.     ??? senna-docusate (SENNA-S) 8.6-50 mg per tablet Take 1 tablet by mouth daily as needed for Constipation. 20 tablet 0   ??? senna-docusate (SENNA-S) 8.6-50 mg per tablet Take 1 tablet by mouth 2 times a day as needed. 30 tablet 0   ??? tamsulosin (FLOMAX) 0.4 mg Cp24 Take 0.4 mg by mouth  daily.       No facility-administered encounter medications on file as of 12/23/2015.        Histories  She has no past medical history on file.    She has past surgical history that includes Cystoscopy w/ ureteroscopy w/ lithotripsy (Right, 11/12/2015).    Her family history is not on file.    She reports that she quit smoking about 3 months ago. She does not have any smokeless tobacco history on file. She reports that she drinks alcohol. She reports that she does not use illicit drugs.    The following portions of the patient's history were reviewed and updated as appropriate: allergies, current medications, past family history, past medical history, past social history, past surgical history and problem list.    There were no vitals taken for this visit.     Physical Exam   Vitals reviewed.  Constitutional: She is oriented to person, place, and time. She appears well-developed and well-nourished.   HENT:   Head: Normocephalic and atraumatic.   Eyes: EOM are normal. Pupils are equal, round, and reactive to light.   Neck: Normal range of motion. Neck supple.   Cardiovascular: Normal rate, regular rhythm and intact distal pulses.    Pulmonary/Chest: Effort normal. She has no wheezes.   Abdominal: Soft. Normal appearance and bowel sounds are normal. She exhibits no distension. There is no hepatosplenomegaly. There is  no tenderness. There is no rebound and no guarding. No hernia.   Musculoskeletal: Normal range of motion. She exhibits no edema.   Neurological: She is alert and oriented to person, place, and time. No cranial nerve deficit.   Skin: Skin is warm. No erythema.         Assessment      Plan      Medical Decision Making  The following items were considered in medical decision making:  Review / order clinical lab tests  Review / order radiology tests  Review / order other diagnostic tests/interventions

## 2015-12-23 ENCOUNTER — Encounter

## 2015-12-23 NOTE — Unmapped (Signed)
ERROR

## 2016-01-11 ENCOUNTER — Encounter

## 2017-01-06 DIAGNOSIS — D649 Anemia, unspecified: Secondary | ICD-10-CM

## 2017-01-06 NOTE — ED Notes (Signed)
Bed: B25  Expected date:   Expected time:   Means of arrival:   Comments:  Next bed [facility].     Cristino Martes  01/06/17 2340

## 2017-01-06 NOTE — ED Provider Notes (Signed)
[facility]  EMERGENC[facility] ENCOUNTER          PHYSICIAN ASSISTANT NOTE       Date of evaluation: 01/06/2017    Chief Complaint     Flank Pain (Pt having left side flank pain. )      History of Present Illness     Heather Sampson is a 31 y.o. female who presents With complaint of left sided rib pain and lower abdominal pain.  Patient states a couple of days ago she first started having pain in the left lower abdomen.  She states that then it progressed and she now feels a sharp pain in the left lateral ribs.  She states it is worse with certain movements and when taking a deep breath.  She states that at her job this evening she was lifting things and becoming lightheaded.  She is not having pain like this in the past.  Denies any alleviating factors.  Patient admits to a history of a kidney stone on the right that required emergent surgery.  She denies nausea or vomiting, vaginal discharge or bleeding.  Denies fevers or chills.  She is not on oral contraceptive pills, has not had recent surgery or travel and denies any leg pain or swelling.    Review of Systems     Review of Systems   Constitutional: Negative for chills and fever.   Respiratory: Negative for chest tightness, shortness of breath and wheezing.    Cardiovascular: Positive for chest pain (left lateral chest wall).   Gastrointestinal: Positive for abdominal pain. Negative for nausea and vomiting.   Genitourinary: Negative for dysuria, hematuria, vaginal bleeding and vaginal discharge.   Musculoskeletal: Negative for back pain and neck pain.   Skin: Negative for color change and wound.   Neurological: Positive for light-headedness. Negative for headaches.   Hematological: Does not bruise/bleed easily.   All other systems reviewed and are negative.      Past Medical, Surgical, Family, and Social History     She has a past medical history of Bladder infection; Kidney stone; and Ovarian cyst.  She has a past surgical history that includes  Ureter stent placement (Right).  Her family history is not on file.  She reports that she has been smoking Cigars.  She has never used smokeless tobacco. She reports that she drinks alcohol. She reports that she does not use drugs.    Medications     Previous Medications    No medications on file       Allergies     She has No Known Allergies.    Physical Exam     INITIAL VITALS: BP: 112/80, Temp: 98.5 ??F (36.9 ??C), Pulse: 67, Resp: 18, SpO2: 100 %   Physical Exam   Constitutional: She is oriented to person, place, and time. She appears well-developed and well-nourished. No distress.   HENT:   Head: Normocephalic and atraumatic.   Cardiovascular: Normal rate, regular rhythm, normal heart sounds and intact distal pulses.    Pulmonary/Chest: Effort normal and breath sounds normal. No respiratory distress. She has no wheezes. She exhibits tenderness (left lateral chest wall tender to palpation without overlying skin changes).   Abdominal: Soft. There is tenderness in the left lower quadrant. There is CVA tenderness (left). There is no rebound and no guarding.   Genitourinary: Rectal exam shows no tenderness, anal tone normal and guaiac negative stool. Cervix exhibits no motion tenderness and no friability. Right adnexum displays no tenderness. Left  adnexum displays no tenderness. Vaginal discharge (thin white discharge) found.   Neurological: She is alert and oriented to person, place, and time.   Skin: Skin is warm and dry. No rash noted. She is not diaphoretic. No erythema.   Psychiatric: She has a normal mood and affect. Her behavior is normal. Judgment and thought content normal.   Nursing note and vitals reviewed.      Diagnostic Results     RADIOLOGY:  CT ABDOMEN PELVIS W IV CONTRAST Additional Contrast? None   Final Result   Impression:   1. No acute abnormality.   2. Constipation.   3. Nonobstructing 7 mm right renal calculus.            Slot 64      Electronically signed by:  Rickard Rhymes, M.D.    01/07/2017  2:56:00 AM      XR CHEST STANDARD (2 VW)   Final Result   Impression:      No acute cardiopulmonary process seen.      Electronically signed by:  Jule Economy, M.D.    01/07/2017 12:28:00 AM          LABS:   Results for orders placed or performed during the hospital encounter of 01/06/17   Wet prep, genital   Result Value Ref Range    Trichomonas Prep None Seen     Yeast, Wet Prep None Seen     Clue Cells, Wet Prep None Seen     WBC, Wet Prep 1+     RBC, Wet Prep None Seen     Epi Cells 2+     Bacteria 2+     Source Wet Prep Vaginal    CBC auto differential   Result Value Ref Range    WBC 6.2 4.0 - 11.0 K/uL    RBC 3.90 (L) 4.00 - 5.20 M/uL    Hemoglobin 8.7 (L) 12.0 - 16.0 g/dL    Hematocrit 09.6 (L) 36.0 - 48.0 %    MCV 71.6 (L) 80.0 - 100.0 fL    MCH 22.3 (L) 26.0 - 34.0 pg    MCHC 31.1 31.0 - 36.0 g/dL    RDW 04.5 (H) 40.9 - 15.4 %    Platelets 302 135 - 450 K/uL    MPV 8.0 5.0 - 10.5 fL    Neutrophils % 44.0 %    Lymphocytes % 47.8 %    Monocytes % 6.7 %    Eosinophils % 0.9 %    Basophils % 0.6 %    Neutrophils # 2.7 1.7 - 7.7 K/uL    Lymphocytes # 3.0 1.0 - 5.1 K/uL    Monocytes # 0.4 0.0 - 1.3 K/uL    Eosinophils # 0.1 0.0 - 0.6 K/uL    Basophils # 0.0 0.0 - 0.2 K/uL   POC Pregnancy Urine Qual   Result Value Ref Range    Pregnancy, Urine Negative Detects HCG level >25 MIU/mL   POC URINE with Microscopic   Result Value Ref Range    Color, UA Not Entered Straw/Yellow    Clarity, UA Not Entered Clear    Glucose, Ur Negative Negative mg/dL    Bilirubin Urine Negative Negative mg/dL    Ketones, Urine Negative Negative mg/dL    Specific Gravity, UA >=1.030 1.005 - 1.030    Blood, Urine Negative Negative    pH, UA 6.0 5.0 - 8.0    Protein, UA Negative Negative mg/dL    Urobilinogen, Urine 0.2 <2.0 E.U./dL  Nitrite, Urine Negative Negative    Leukocyte Esterase, Urine Negative Negative    Microscopic Examination SEE BELOW    POCT Venous   Result Value Ref Range    POC Sodium 139 136 - 145 mmol/L    POC Potassium  4.1 3.5 - 5.1 mmol/L    POC Chloride 105 99 - 110 mmol/L    CO2 23 21 - 32 mmol/L    POC Anion Gap 11 10 - 20    POC Glucose 87 70 - 99 mg/dl    POC BUN 11 7 - 18 mg/dL    POC Creatinine 0.5 (L) 0.6 - 1.1 mg/dL    GFR Non-African American >60 >60    GFR African American >60     Calcium, Ion 1.21 1.12 - 1.32 mmol/L    Sample Type VEN     Performed on SEE BELOW    POCT Blood Occult   Result Value Ref Range    POC Occult Blood Stool Negative Negative       ED BEDSIDE ULTRASOUND:      RECENT VITALS:  BP: 112/80, Temp: 98.5 ??F (36.9 ??C), Pulse: 67, Resp: 18, SpO2: 100 %     Procedures         ED Course     Nursing Notes, Past Medical Hx, Past Surgical Hx, Social Hx, Allergies, and Family Hx were reviewed.    The patient was given the following medications:  Orders Placed This Encounter   Medications   ??? ketorolac (TORADOL) injection 15 mg   ??? morphine injection 4 mg   ??? ondansetron (ZOFRAN) injection 4 mg   ??? iopamidol (ISOVUE-370) 76 % injection 80 mL   ??? naproxen (NAPROSYN) 500 MG tablet     Sig: Take 1 tablet by mouth 2 times daily     Dispense:  60 tablet     Refill:  0       CONSULTS:  None    MEDICAL DECISION MAKING / ASSESSMENT / PLAN     Heather Sampson is a 31 y.o. female who presents emergency Department with complaint of a cough that has developed into left lateral chest pain and left pelvic pain.  Patient is well-appearing and in no acute distress but had decreased breath sounds bilaterally.      CBC had no leukocytosis with an H&H of 8.7 and 27.9, which is a significant change from 2016 but was 11.2 and 34.1, respectively.  Hemoccult was negative.  I reviewed outside records and could not find any results that are more recent.  Chemistry panel was unremarkable.    Urinalysis normal.  Urine pregnancy negative.  Her wet prep she did have some tenderness of her cervix with no adnexal tenderness.    Initial considerations were a PE as etiology of her left lateral chest wall pain, however, I believe this is less  likely due to her tenderness with palpation of her chest and she is PERC negative for risk factors.  Patient had a chest x-ray that was negative.  She has no overlying skin changes concerning for shingles or other infectious process.  I do not feel this is a Pyelonephritis or UTI with her normal and urinalysis.    Patient's lower pelvic pain may be related to a sexually transmitted disease based on her pelvic tenderness.  Wet prep was sent And was negative.  Chlamydia and gonorrhea DNA swab is in process.  Patient does report that she has not been sexually active for over  a year so this would lower my suspicion as an STD causing her lower abdominal pain.    Because of patient's significant, we did obtain a CT with IV contrast to evaluate for intra-abdominal process such as ovarian cyst, kidney stone, colitis.  CT showed no acute abnormality, constipation and a nonobstructing right renal calculus.  There was heterogeneity possibly representing an underlying uterine fibroid.  Patient will be treated with anti-inflammatories and advised to follow up with the primary care provider and ObGyn for reevaluation.  She is in agreement complaint stable for discharge.    This patient was also evaluated by the attending physician. All care plans were discussed and agreed upon.    Clinical Impression     1. Anemia, unspecified type    2. Left-sided chest wall pain    3. Lower abdominal pain        Disposition     PATIENT REFERRED TO:  [facility]  [address]  [place] [address]  [phone]            DISCHARGE MEDICATIONS:  New Prescriptions    NAPROXEN (NAPROSYN) 500 MG TABLET    Take 1 tablet by mouth 2 times daily       DISPOSITION Decision To Discharge 01/07/2017 03:02:45 AM       [place], [place]  01/07/17 [phone]

## 2017-01-06 NOTE — ED Provider Notes (Signed)
ED Attending Attestation Note     Date of evaluation: 01/06/2017    This patient was seen by the advance practice provider.  I have seen and examined the patient, agree with the workup, evaluation, management and diagnosis. The care plan has been discussed.  My assessment reveals patient with LUQ and LLQ abdominal pain.  Getting ready to start menses.  Very uncomfortable but labs and UA negative.  CT performed showing fibroids but no other pathology.     Durward FortesMichael J Yanina Knupp, MD  01/07/17 [phone]

## 2017-01-07 ENCOUNTER — Inpatient Hospital Stay
Admit: 2017-01-07 | Discharge: 2017-01-07 | Disposition: A | Discharge status: Home / Self Care | Attending: Emergency Medicine

## 2017-01-07 ENCOUNTER — Encounter: Admit: 2017-01-07 | Primary: Internal Medicine

## 2017-01-07 LAB — POCT VENOUS
CO2: 23 mmol/L (ref 21–32)
Calcium, Ionized: 1.21 mmol/L (ref 1.12–1.32)
GFR African American: >60
GFR Non-African American: >60 (ref >60)
POC Anion Gap: 11 (ref 10–20)
POC BUN: 11 mg/dL (ref 7–18)
POC Chloride: 105 mmol/L (ref 99–110)
POC Creatinine: 0.5 mg/dL — ABNORMAL LOW (ref 0.6–1.1)
POC Glucose: 87 mg/dl (ref 70–99)
POC Potassium: 4.1 mmol/L (ref 3.5–5.1)
POC Sodium: 139 mmol/L (ref 136–145)

## 2017-01-07 LAB — CBC WITH AUTO DIFFERENTIAL
Basophils %: 0.6 %
Basophils Absolute: 0 10*3/uL (ref 0.0–0.2)
Eosinophils %: 0.9 %
Eosinophils Absolute: 0.1 10*3/uL (ref 0.0–0.6)
Hematocrit: 27.9 % — ABNORMAL LOW (ref 36.0–48.0)
Hemoglobin: 8.7 g/dL — ABNORMAL LOW (ref 12.0–16.0)
Lymphocytes %: 47.8 %
Lymphocytes Absolute: 3 10*3/uL (ref 1.0–5.1)
MCH: 22.3 pg — ABNORMAL LOW (ref 26.0–34.0)
MCHC: 31.1 g/dL (ref 31.0–36.0)
MCV: 71.6 fL — ABNORMAL LOW (ref 80.0–100.0)
MPV: 8 fL (ref 5.0–10.5)
Monocytes %: 6.7 %
Monocytes Absolute: 0.4 10*3/uL (ref 0.0–1.3)
Neutrophils %: 44 %
Neutrophils Absolute: 2.7 10*3/uL (ref 1.7–7.7)
Platelets: 302 10*3/uL (ref 135–450)
RBC: 3.9 M/uL — ABNORMAL LOW (ref 4.00–5.20)
RDW: 17.6 % — ABNORMAL HIGH (ref 12.4–15.4)
WBC: 6.2 10*3/uL (ref 4.0–11.0)

## 2017-01-07 LAB — POC URINE WITH MICROSCOPIC
Bilirubin Urine: NEGATIVE mg/dL
Blood, Urine: NEGATIVE
Glucose, Ur: NEGATIVE mg/dL
Ketones, Urine: NEGATIVE mg/dL
Leukocyte Esterase, Urine: NEGATIVE
Nitrite, Urine: NEGATIVE
Protein, UA: NEGATIVE mg/dL
Specific Gravity, UA: >=1.03 (ref 1.005–1.030)
Urobilinogen, Urine: 0.2 E.U./dL (ref <2.0)
pH, UA: 6 (ref 5.0–8.0)

## 2017-01-07 LAB — WET PREP, GENITAL
Clue Cells, Wet Prep: NONE SEEN
RBC, Wet Prep: NONE SEEN
Trichomonas Prep: NONE SEEN
Yeast, Wet Prep: NONE SEEN

## 2017-01-07 LAB — POC PREGNANCY UR-QUAL: Pregnancy, Urine: NEGATIVE

## 2017-01-07 LAB — POCT OCCULT BLOOD STOOL NON CA SCREEN: POC Occult Blood Stool: NEGATIVE

## 2017-01-07 MED ORDER — NAPROXEN 500 MG PO TABS
500 MG | ORAL_TABLET | Freq: Two times a day (BID) | ORAL | 0 refills | Status: DC
Start: 2017-01-07 — End: 2017-10-31

## 2017-01-07 MED ORDER — ONDANSETRON HCL 4 MG/2ML IJ SOLN
4 MG/2ML | INTRAMUSCULAR | Status: DC | PRN
Start: 2017-01-07 — End: 2017-01-07
  Administered 2017-01-07: 07:00:00 4 mg via INTRAVENOUS

## 2017-01-07 MED ORDER — IOPAMIDOL 76 % IV SOLN
76 % | Freq: Once | INTRAVENOUS | Status: AC | PRN
Start: 2017-01-07 — End: 2017-01-07
  Administered 2017-01-07: 07:00:00 80 mL via INTRAVENOUS

## 2017-01-07 MED ORDER — MORPHINE SULFATE 2 MG/ML IJ SOLN
2 MG/ML | INTRAMUSCULAR | Status: DC | PRN
Start: 2017-01-07 — End: 2017-01-07
  Administered 2017-01-07: 07:00:00 4 mg via INTRAVENOUS

## 2017-01-07 MED ORDER — KETOROLAC TROMETHAMINE 30 MG/ML IJ SOLN
30 MG/ML | Freq: Once | INTRAMUSCULAR | Status: AC
Start: 2017-01-07 — End: 2017-01-07
  Administered 2017-01-07: 06:00:00 15 mg via INTRAVENOUS

## 2017-01-07 MED FILL — ONDANSETRON HCL 4 MG/2ML IJ SOLN: 4 MG/2ML | INTRAMUSCULAR | Qty: 2

## 2017-01-07 MED FILL — MORPHINE SULFATE 2 MG/ML IJ SOLN: 2 mg/mL | INTRAMUSCULAR | Qty: 2

## 2017-01-07 MED FILL — KETOROLAC TROMETHAMINE 30 MG/ML IJ SOLN: 30 MG/ML | INTRAMUSCULAR | Qty: 1

## 2017-01-07 NOTE — Discharge Instructions (Signed)
Discharge Instructions    * Take all your medications as directed.  - * Take OTC 600 mg ibuprofen every six hours or 500 mg naproxen every 12 hours for pain. If you develop an upset stomach try eating a small amount of food prior to the medications. Take 650 mg of tylenol every 6 hours for breakthrough pain.     * Follow up with your primary care provider.   * Return to the Emergency Department for any new or worsening symptoms

## 2017-01-07 NOTE — ED Notes (Signed)
Patient prepared for and ready to be discharged. Patient discharged at this time in no acute distress after verbalizing understanding of discharge instructions. Patient left after receiving After Visit Summary instructions.   List of local clinics and discharge instructions to follow up with one given to patient with verbalized understanding. Patient then discharged ambulatory in no distress in good condition to home.        Dagoberto Ligas, RN  01/07/17 [phone]

## 2017-01-09 LAB — C.TRACHOMATIS N.GONORRHOEAE DNA
C. trachomatis DNA: NEGATIVE
N. gonorrhoeae DNA: NEGATIVE

## 2017-09-26 ENCOUNTER — Encounter: Attending: Family | Primary: Internal Medicine

## 2017-10-17 ENCOUNTER — Ambulatory Visit
Admit: 2017-10-17 | Discharge: 2017-10-17 | Payer: BLUE CROSS/BLUE SHIELD | Attending: Obstetrics & Gynecology | Primary: Internal Medicine

## 2017-10-17 DIAGNOSIS — R102 Pelvic and perineal pain: Secondary | ICD-10-CM

## 2017-10-17 MED ORDER — DICLOFENAC POTASSIUM 50 MG PO TABS
50 MG | ORAL_TABLET | Freq: Three times a day (TID) | ORAL | 1 refills | Status: DC | PRN
Start: 2017-10-17 — End: 2017-10-31

## 2017-10-17 MED ORDER — ETHYNODIOL DIAC-ETH ESTRADIOL 1-35 MG-MCG PO TABS
1-35 MG-MCG | PACK | Freq: Every day | ORAL | 3 refills | Status: DC
Start: 2017-10-17 — End: 2018-01-16

## 2017-10-20 NOTE — Progress Notes (Signed)
Subjective:      Patient ID: Heather BushmanRegina Sampson is a 31 y.o. female.    Patient is here with dysmenorrhea and menorrhagia despite OCPs. Bleeding heavy today.        Review of Systems   Constitutional: Negative.    HENT: Negative.    Eyes: Negative.    Respiratory: Negative.    Cardiovascular: Negative.    Gastrointestinal: Negative.    Genitourinary: Positive for menstrual problem and pelvic pain.   Musculoskeletal: Negative.    Skin: Negative.    Neurological: Negative.    Psychiatric/Behavioral: Negative.      Date of Birth 1986/08/14  Past Medical History:   Diagnosis Date   . Bladder infection    . Kidney stone    . Ovarian cyst      Past Surgical History:   Procedure Laterality Date   . URETER STENT PLACEMENT Right      OB History   Gravida Para Term Preterm AB Living   1       1     SAB TAB Ectopic Molar Multiple Live Births   1                # Outcome Date GA Lbr Len/2nd Weight Sex Delivery Anes PTL Lv   1 SAB 2018                Social History     Social History   . Marital status: Single     Spouse name: N/A   . Number of children: N/A   . Years of education: N/A     Occupational History   . Not on file.     Social History Main Topics   . Smoking status: Current Some Day Smoker     Packs/day: 0.25     Years: 1.00     Types: Cigars   . Smokeless tobacco: Never Used      Comment: 1 black and mild/day   . Alcohol use Yes      Comment: 1 drink/month   . Drug use: No   . Sexual activity: Not Currently     Other Topics Concern   . Not on file     Social History Narrative   . No narrative on file     No Known Allergies  Outpatient Prescriptions Marked as Taking for the 10/17/17 encounter (Office Visit) with Estill Battenaroline J Dehlia Kilner, MD   Medication Sig Dispense Refill   . ethynodiol-ethinyl estradiol (ZOVIA 1/35E, 28,) 1-35 MG-MCG per tablet Take 1 tablet by mouth daily continuously 3 packet 3   . diclofenac (CATAFLAM) 50 MG tablet Take 1 tablet by mouth every 8 hours as needed for Pain 30 tablet 1   . naproxen (NAPROSYN)  500 MG tablet Take 1 tablet by mouth 2 times daily 60 tablet 0     History reviewed. No pertinent family history.  BP 120/70 (Site: Right Upper Arm, Position: Sitting, Cuff Size: Large Adult)   Pulse 66   Resp 17   Ht 5\' 5"  (1.651 m)   Wt 164 lb 4 oz (74.5 kg)   LMP 10/17/2017   BMI 27.33 kg/m       Objective:   Physical Exam   Constitutional: She is oriented to person, place, and time. She appears well-developed and well-nourished. No distress.   HENT:   Head: Normocephalic and atraumatic.   Eyes: Pupils are equal, round, and reactive to light. EOM are normal.   Neck: Normal range  of motion.   Cardiovascular: Exam reveals no gallop.    Abdominal: Soft. She exhibits no distension and no mass. There is no tenderness. There is no rebound and no guarding.   Musculoskeletal: Normal range of motion. She exhibits no edema or tenderness.   Neurological: She is alert and oriented to person, place, and time.   Skin: Skin is warm and dry.   Psychiatric: She has a normal mood and affect. Her behavior is normal.       Assessment:      1. Pelvic pain female  2. Dysmenorrhea  3. menorrhagia      Plan:      1-3. Begin Zovia continuously, pelvic US, cataflam  RTC 3 months to see how she is feeling        Estill Battenaroline J Rishikesh Khachatryan, MD

## 2017-10-25 ENCOUNTER — Inpatient Hospital Stay: Admit: 2017-10-25 | Payer: BLUE CROSS/BLUE SHIELD | Primary: Internal Medicine

## 2017-10-25 DIAGNOSIS — R102 Pelvic and perineal pain: Secondary | ICD-10-CM

## 2017-10-31 ENCOUNTER — Ambulatory Visit
Admit: 2017-10-31 | Discharge: 2017-10-31 | Payer: BLUE CROSS/BLUE SHIELD | Attending: Internal Medicine | Primary: Internal Medicine

## 2017-10-31 DIAGNOSIS — F418 Other specified anxiety disorders: Secondary | ICD-10-CM

## 2017-10-31 MED ORDER — BUSPIRONE HCL 7.5 MG PO TABS
7.5 MG | ORAL_TABLET | Freq: Three times a day (TID) | ORAL | 0 refills | Status: DC
Start: 2017-10-31 — End: 2019-09-19

## 2017-10-31 MED ORDER — PAROXETINE HCL 10 MG PO TABS
10 MG | ORAL_TABLET | Freq: Every morning | ORAL | 1 refills | Status: DC
Start: 2017-10-31 — End: 2017-12-30

## 2017-10-31 NOTE — Progress Notes (Signed)
PROGRESS NOTE:    Heather BushmanRegina Sampson    11/02/2017    Chief Complaint   Patient presents with   . New Patient   . Health Maintenance     Patient advised of need for flu vaccine and declines.     HPI:    Mr(s). Heather BushmanRegina Sampson presents to clinic today with issues noted above.      She is here to establish and she is admitting to depressive symptoms but only scored 1 on PHQ-9.     Feels she gets panicky and hyperventilate and feels SOB and almost passes out, happens more with when thinking of her grandmother who passed away.       Patient denies chest pain, SOB, NVD, FC, rash, malaise, rigor, dizziness/lightheadness, other pertinent ROS was also reviewed.          BP 114/60 (Site: Right Upper Arm, Position: Sitting, Cuff Size: Medium Adult)   Pulse 72 Comment: Regular  Temp 98.9 F (37.2 C) (Oral)   Resp 16   Ht 5' 6.5" (1.689 m)   Wt 171 lb (77.6 kg)   LMP 10/17/2017   BMI 27.19 kg/m   Body mass index is 27.19 kg/m.    No Known Allergies    Physical Exam:    Gen: Patient appears well groomed, well appearing  HEAD: Atraumatic, normocephalic,   Eyes: PERRLA, EOMI   Neck: supple, no thyroid nodule appreciated, no JVD  Chest: Clear to auscultation BIL, unlabored breathing, normal expansion  Heart: Regular rate, regular rhythm, no murmur, no rub  Abdomen: Non-tender, non-distended, bowel sounds present x3  Extremities: no edema, distal pulses intact  Patient was alert and oriented to person, place and time    Assessment and Plan:    Heather KocherRegina was seen today for new patient and health maintenance.    Diagnoses and all orders for this visit:    Depression with anxiety  More anxiety related issues due to family issues, seems to also have episodes of panic attacks with hyperventilation and SOB and presyncopal, start paxil and prn buspar    History of nephrolithiasis  She stated it was calcium stones, given low oxylate diet PDF    Other orders  -     PARoxetine (PAXIL) 10 MG tablet; Take 1 tablet by mouth every morning  -      busPIRone (BUSPAR) 7.5 MG tablet; Take 1 tablet by mouth 3 times daily    No orders of the defined types were placed in this encounter.      Prior to Admission medications    Medication Sig Start Date End Date Taking? Authorizing Provider   PARoxetine (PAXIL) 10 MG tablet Take 1 tablet by mouth every morning 10/31/17  Yes Baruch GoldmannPeng E Atilla Zollner, DO   busPIRone (BUSPAR) 7.5 MG tablet Take 1 tablet by mouth 3 times daily 10/31/17  Yes Baruch GoldmannPeng E Kassie Keng, DO   ethynodiol-ethinyl estradiol (ZOVIA 1/35E, 28,) 1-35 MG-MCG per tablet Take 1 tablet by mouth daily continuously 10/17/17  Yes Estill Battenaroline J Bohme, MD       Past Medical History:   Diagnosis Date   . Anxiety    . Bladder infection    . Depression    . Kidney stone    . Ovarian cyst        Past Surgical History:   Procedure Laterality Date   . URETER STENT PLACEMENT Right        Social History   Substance Use Topics   . Smoking status:  Current Some Day Smoker     Packs/day: 0.25     Years: 1.00     Types: Cigars   . Smokeless tobacco: Never Used      Comment: 1 black and mild/day, smoking cessation 11/18   . Alcohol use Yes      Comment: 1 drink/month       History reviewed. No pertinent family history.

## 2017-10-31 NOTE — Patient Instructions (Addendum)
Patient Education        Anxiety Disorder: Care Instructions  Your Care Instructions    Anxiety is a normal reaction to stress. Difficult situations can cause you to have symptoms such as sweaty palms and a nervous feeling.  In an anxiety disorder, the symptoms are far more severe. Constant worry, muscle tension, trouble sleeping, nausea and diarrhea, and other symptoms can make normal daily activities difficult or impossible. These symptoms may occur for no reason, and they can affect your work, school, or social life. Medicines, counseling, and self-care can all help.  Follow-up care is a key part of your treatment and safety. Be sure to make and go to all appointments, and call your doctor if you are having problems. It's also a good idea to know your test results and keep a list of the medicines you take.  How can you care for yourself at home?   Take medicines exactly as directed. Call your doctor if you think you are having a problem with your medicine.   Go to your counseling sessions and follow-up appointments.   Recognize and accept your anxiety. Then, when you are in a situation that makes you anxious, say to yourself, "This is not an emergency. I feel uncomfortable, but I am not in danger. I can keep going even if I feel anxious."   Be kind to your body:  ? Relieve tension with exercise or a massage.  ? Get enough rest.  ? Avoid alcohol, caffeine, nicotine, and illegal drugs. They can increase your anxiety level and cause sleep problems.  ? Learn and do relaxation techniques. See below for more about these techniques.   Engage your mind. Get out and do something you enjoy. Go to a funny movie, or take a walk or hike. Plan your day. Having too much or too little to do can make you anxious.   Keep a record of your symptoms. Discuss your fears with a good friend or family member, or join a support group for people with similar problems. Talking to others sometimes relieves stress.   Get involved in  social groups, or volunteer to help others. Being alone sometimes makes things seem worse than they are.   Get at least 30 minutes of exercise on most days of the week to relieve stress. Walking is a good choice. You also may want to do other activities, such as running, swimming, cycling, or playing tennis or team sports.  Relaxation techniques  Do relaxation exercises 10 to 20 minutes a day. You can play soothing, relaxing music while you do them, if you wish.   Tell others in your house that you are going to do your relaxation exercises. Ask them not to disturb you.   Find a comfortable place, away from all distractions and noise.   Lie down on your back, or sit with your back straight.   Focus on your breathing. Make it slow and steady.   Breathe in through your nose. Breathe out through either your nose or mouth.   Breathe deeply, filling up the area between your navel and your rib cage. Breathe so that your belly goes up and down.   Do not hold your breath.   Breathe like this for 5 to 10 minutes. Notice the feeling of calmness throughout your whole body.  As you continue to breathe slowly and deeply, relax by doing the following for another 5 to 10 minutes:   Tighten and relax each muscle group in   your body. You can begin at your toes and work your way up to your head.   Imagine your muscle groups relaxing and becoming heavy.   Empty your mind of all thoughts.   Let yourself relax more and more deeply.   Become aware of the state of calmness that surrounds you.   When your relaxation time is over, you can bring yourself back to alertness by moving your fingers and toes and then your hands and feet and then stretching and moving your entire body. Sometimes people fall asleep during relaxation, but they usually wake up shortly afterward.   Always give yourself time to return to full alertness before you drive a car or do anything that might cause an accident if you are not fully alert. Never  play a relaxation tape while you drive a car.  When should you call for help?  Call 911 anytime you think you may need emergency care. For example, call if:    You feel you cannot stop from hurting yourself or someone else.   Keep the numbers for these national suicide hotlines: 1-800-273-TALK (1-800-273-8255) and 1-800-SUICIDE (1-800-784-2433). If you or someone you know talks about suicide or feeling hopeless, get help right away.  Watch closely for changes in your health, and be sure to contact your doctor if:    You have anxiety or fear that affects your life.     You have symptoms of anxiety that are new or different from those you had before.   Where can you learn more?  Go to [email or web address] and sign in to your MyChart account. Enter P754 in the Search Health Information box to learn more about "Anxiety Disorder: Care Instructions."     If you do not have an account, please click on the "Sign Up Now" link.  Current as of: November 10, 2016  Content Version: 11.8   2006-2018 [employer]. Care instructions adapted under license by [facility]. If you have questions about a medical condition or this instruction, always ask your healthcare professional. [employer] disclaims any warranty or liability for your use of this information.

## 2017-12-19 ENCOUNTER — Ambulatory Visit
Admit: 2017-12-19 | Discharge: 2017-12-19 | Payer: PRIVATE HEALTH INSURANCE | Attending: Obstetrics & Gynecology | Primary: Internal Medicine

## 2017-12-19 DIAGNOSIS — Z01419 Encounter for gynecological examination (general) (routine) without abnormal findings: Secondary | ICD-10-CM

## 2017-12-19 MED ORDER — MEDROXYPROGESTERONE ACETATE 10 MG PO TABS
10 MG | ORAL_TABLET | Freq: Every day | ORAL | 1 refills | Status: DC
Start: 2017-12-19 — End: 2018-01-16

## 2017-12-20 NOTE — Progress Notes (Signed)
Subjective:      Patient ID: Heather Sampson is a 32 y.o. female.    Patient is here for annual. Patient with worsening menorrhagia, pelvic pain, dysmenorrhea, patient with fibroids. Patient for robotic assisted myomectomy, possible laparotomy.         Review of Systems   Constitutional: Negative.    HENT: Negative.    Eyes: Negative.    Respiratory: Negative.    Cardiovascular: Negative.    Gastrointestinal: Negative.    Genitourinary: Positive for menstrual problem and pelvic pain.   Musculoskeletal: Negative.    Skin: Negative.    Neurological: Negative.    Psychiatric/Behavioral: Negative.      Date of Birth 1986-08-28  Past Medical History:   Diagnosis Date   . Anxiety    . Bladder infection    . Depression    . Kidney stone    . Ovarian cyst      Past Surgical History:   Procedure Laterality Date   . URETER STENT PLACEMENT Right      OB History   Gravida Para Term Preterm AB Living   1       1     SAB TAB Ectopic Molar Multiple Live Births   1                # Outcome Date GA Lbr Len/2nd Weight Sex Delivery Anes PTL Lv   1 SAB 2018                Social History     Social History   . Marital status: Single     Spouse name: N/A   . Number of children: N/A   . Years of education: N/A     Occupational History   . Not on file.     Social History Main Topics   . Smoking status: Former Smoker     Packs/day: 0.25     Years: 1.00     Types: Cigars     Start date: 12/05/2014     Quit date: 12/04/2017   . Smokeless tobacco: Never Used      Comment: 1 black and mild/day, smoking cessation 11/18   . Alcohol use Yes      Comment: 1 drink/month   . Drug use: No   . Sexual activity: Not Currently     Other Topics Concern   . Not on file     Social History Narrative   . No narrative on file     No Known Allergies  Outpatient Prescriptions Marked as Taking for the 12/19/17 encounter (Office Visit) with Estill Batten, MD   Medication Sig Dispense Refill   . medroxyPROGESTERone (PROVERA) 10 MG tablet Take 2 tablets by mouth daily  60 tablet 1   . PARoxetine (PAXIL) 10 MG tablet Take 1 tablet by mouth every morning 30 tablet 1   . busPIRone (BUSPAR) 7.5 MG tablet Take 1 tablet by mouth 3 times daily 30 tablet 0     No family history on file.  BP 124/78   Pulse 80   Temp 99.5 F (37.5 C)   Resp 14   Ht 5' 6.6" (1.692 m)   Wt 171 lb 9.6 oz (77.8 kg)   LMP 11/27/2017 (Exact Date)   SpO2 100%   Breastfeeding? No   BMI 27.20 kg/m       Objective:   Physical Exam   Constitutional: She is oriented to person, place, and time. She appears well-developed  and well-nourished. No distress.   HENT:   Head: Normocephalic and atraumatic.   Eyes: Pupils are equal, round, and reactive to light. EOM are normal.   Neck: Normal range of motion. Neck supple. No thyromegaly present.   Cardiovascular: Normal rate, regular rhythm and normal heart sounds.  Exam reveals no gallop and no friction rub.    No murmur heard.  Pulmonary/Chest: Effort normal and breath sounds normal. No respiratory distress. She has no wheezes. She has no rales.   Abdominal: Soft. She exhibits no distension and no mass. There is no hepatomegaly. There is no tenderness. There is no rebound and no guarding. No hernia.   Genitourinary: Vagina normal. Rectal exam shows no external hemorrhoid and no fissure. No breast tenderness, discharge or bleeding. Pelvic exam was performed with patient supine. No labial fusion. There is no rash, tenderness, lesion or injury on the right labia. There is no rash, tenderness, lesion or injury on the left labia. Uterus is enlarged. Uterus is not deviated, not fixed and not tender. Cervix exhibits no motion tenderness, no discharge and no friability. Right adnexum displays no mass, no tenderness and no fullness. Left adnexum displays no mass, no tenderness and no fullness. No erythema, tenderness or bleeding in the vagina. No foreign body in the vagina. No signs of injury around the vagina. No vaginal discharge found.   Genitourinary Comments: Normal  urethral meatus, nl urethra, nl bladder.    Enlarged uterus     Musculoskeletal: Normal range of motion.   Lymphadenopathy:        Right: No inguinal adenopathy present.        Left: No inguinal adenopathy present.   Neurological: She is alert and oriented to person, place, and time. She has normal reflexes.   Skin: Skin is warm and dry.   Psychiatric: She has a normal mood and affect. Her behavior is normal. Judgment and thought content normal.       Assessment:      1. Annual  2. Menorrhagia  3. Dysmenorrhea  4. Anemia  5. fibroids      Plan:      1. Pap, calcium, exercise  2-5. For robotic assisted myomectomy, possible laparotomy. All the risks, benefits alternatives discussed with patient including bleeding, blood transfusion, infection, damage to the bowel, bladder, other local organs with need for secondary surgery, anesthesia risks, blood clots in the lungs, legs, pelvis after surgery. Patient understands these risks and agrees to the procedure. Patient also understands there may be other unforseen risks.        Estill Battenaroline J Payslee Bateson, MD

## 2017-12-21 LAB — HUMAN PAPILLOMAVIRUS (HPV) DNA PROBE THIN PREP HIGH RISK
HPV Genotype 16: DETECTED — AB
HPV Type 18: NOT DETECTED
HPVOH (OTHER TYPES): DETECTED — AB

## 2017-12-26 ENCOUNTER — Encounter: Attending: Internal Medicine | Primary: Internal Medicine

## 2017-12-27 MED ORDER — METRONIDAZOLE 500 MG PO TABS
500 MG | ORAL_TABLET | Freq: Two times a day (BID) | ORAL | 0 refills | Status: AC
Start: 2017-12-27 — End: 2018-01-03

## 2018-01-01 ENCOUNTER — Ambulatory Visit: Admit: 2018-01-01 | Payer: PRIVATE HEALTH INSURANCE | Attending: Obstetrics & Gynecology | Primary: Internal Medicine

## 2018-01-01 DIAGNOSIS — R87610 Atypical squamous cells of undetermined significance on cytologic smear of cervix (ASC-US): Secondary | ICD-10-CM

## 2018-01-01 LAB — POCT URINE PREGNANCY: Preg Test, Ur: NEGATIVE

## 2018-01-02 MED ORDER — PAROXETINE HCL 10 MG PO TABS
10 MG | ORAL_TABLET | Freq: Every morning | ORAL | 0 refills | Status: DC
Start: 2018-01-02 — End: 2018-01-30

## 2018-01-03 NOTE — Progress Notes (Signed)
Subjective:      Patient ID: Heather BushmanRegina Sampson is a 32 y.o. female.    Patient is here for pre-operative visit-robotic assisted myomectomy. Patient with ascus pap cannot r/o hgsil. For colposcopy. May need leep, also.        Review of Systems   Constitutional: Negative.    HENT: Negative.    Eyes: Negative.    Respiratory: Negative.    Cardiovascular: Negative.    Gastrointestinal: Negative.    Genitourinary: Positive for menstrual problem and pelvic pain.   Musculoskeletal: Negative.    Skin: Negative.    Neurological: Negative.    Psychiatric/Behavioral: Negative.      Date of Birth November 13, 1986  Past Medical History:   Diagnosis Date   . Abnormal Pap smear of cervix 12/19/2017    ascus and hpv+   . Anxiety    . Bladder infection    . Depression    . 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 Kidney stone    . Ovarian cyst      Past Surgical History:   Procedure Laterality Date   . URETER STENT PLACEMENT Right      OB History   Gravida Para Term Preterm AB Living   1       1     SAB TAB Ectopic Molar Multiple Live Births   1                # Outcome Date GA Lbr Len/2nd Weight Sex Delivery Anes PTL Lv   1 SAB 2018                Social History     Social History   . Marital status: Single     Spouse name: N/A   . Number of children: N/A   . Years of education: N/A     Occupational History   . Not on file.     Social History Main Topics   . Smoking status: Former Smoker     Packs/day: 0.25     Years: 1.00     Types: Cigars     Start date: 12/05/2014     Quit date: 12/04/2017   . Smokeless tobacco: Never Used      Comment: 1 black and mild/day, smoking cessation 11/18   . Alcohol use Yes      Comment: 1 drink/month   . Drug use: No   . Sexual activity: Not Currently     Other Topics Concern   . Not on file     Social History Narrative   . No narrative on file     No Known Allergies  No outpatient prescriptions have been marked as taking for the 01/01/18 encounter (Office Visit) with Estill Battenaroline J  Abbi Mancini, MD.     History reviewed. No pertinent family history.  BP 122/74 (Site: Left Upper Arm, Position: Sitting, Cuff Size: Medium Adult)   Pulse 89   Resp 17   Ht 5\' 5"  (1.651 m)   Wt 174 lb (78.9 kg)   LMP 11/27/2017 (Exact Date)   Breastfeeding? No   BMI 28.96 kg/m       Objective:   Physical Exam   Constitutional: She is oriented to person, place, and time. She appears well-developed and well-nourished. No distress.   HENT:   Head: Normocephalic and atraumatic.   Eyes: Pupils are equal, round, and reactive to light. EOM are normal.   Neck: Normal  range of motion. Neck supple. No thyromegaly present.   Cardiovascular: Normal rate, regular rhythm and normal heart sounds.  Exam reveals no gallop and no friction rub.    No murmur heard.  Pulmonary/Chest: Effort normal and breath sounds normal. No respiratory distress. She has no wheezes. She has no rales.   Abdominal: Soft. She exhibits no distension and no mass. There is no hepatomegaly. There is no tenderness. There is no rebound and no guarding. No hernia.   Genitourinary: Vagina normal. Rectal exam shows no external hemorrhoid and no fissure. Pelvic exam was performed with patient supine. No labial fusion. There is no rash, tenderness, lesion or injury on the right labia. There is no rash, tenderness, lesion or injury on the left labia. Uterus is enlarged. Uterus is not deviated, not fixed and not tender. Cervix exhibits no motion tenderness, no discharge and no friability. Right adnexum displays no mass, no tenderness and no fullness. Left adnexum displays no mass, no tenderness and no fullness. No erythema, tenderness or bleeding in the vagina. No foreign body in the vagina. No signs of injury around the vagina. No vaginal discharge found.   Genitourinary Comments: Normal urethral meatus, nl urethra, nl bladder.  Enlarged uterus.     Musculoskeletal: Normal range of motion.   Lymphadenopathy:        Right: No inguinal adenopathy present.        Left:  No inguinal adenopathy present.   Neurological: She is alert and oriented to person, place, and time. She has normal reflexes.   Skin: Skin is warm and dry.   Psychiatric: She has a normal mood and affect. Her behavior is normal. Judgment and thought content normal.       Assessment:      1. Enlarged fibroid uterus  2. Menorrhagia  3. Dysmenorrhea  4. Ascus pap cannot r/o hgsil      Plan:      1-3. For robotic assisted myomectomy  4. For colposcopy.   Colposcopy Procedure Note    Indications: Pap smear 1 months ago showed: ASCUS cannot r/o hgsil with POSITIVE high risk HPV. The prior pap showed no abnormalities.   Procedure Details   The risks and benefits of the procedure and verbal informed consent obtained.    Speculum placed in vagina and excellent visualization of cervix achieved, cervix swabbed x 3 with acetic acid solution.    Findings:  Cervix: AW changes around os,  at 6 pm. No puctation. TZ well visualized.  Vaginal inspection: vaginal colposcopy not performed.  Vulvar colposcopy: vulvar colposcopy not performed.    Specimens: BX 6 oclock ectocervical, ECC    Complications: none.    Plan:  Will base further treatment on Pathology findings.    Possible leep at time of surgery            Estill Batten, MD

## 2018-01-08 ENCOUNTER — Inpatient Hospital Stay: Admit: 2018-01-08 | Discharge: 2018-01-18 | Payer: PRIVATE HEALTH INSURANCE | Primary: Internal Medicine

## 2018-01-08 DIAGNOSIS — Z01818 Encounter for other preprocedural examination: Secondary | ICD-10-CM

## 2018-01-08 LAB — EKG 12-LEAD
Atrial Rate: 66 {beats}/min
P Axis: 19 degrees
P-R Interval: 156 ms
Q-T Interval: 386 ms
QRS Duration: 76 ms
QTc Calculation (Bazett): 404 ms
R Axis: 43 degrees
T Axis: 17 degrees
Ventricular Rate: 66 {beats}/min

## 2018-01-08 LAB — BASIC METABOLIC PANEL
Anion Gap: 11 (ref 3–16)
BUN: 7 mg/dL (ref 7–20)
CO2: 23 mmol/L (ref 21–32)
Calcium: 9.2 mg/dL (ref 8.3–10.6)
Chloride: 104 mmol/L (ref 99–110)
Creatinine: <0.5 mg/dL — ABNORMAL LOW (ref 0.6–1.1)
GFR African American: >60 (ref >60)
GFR Non-African American: >60 (ref >60)
Glucose: 99 mg/dL (ref 70–99)
Potassium: 4.5 mmol/L (ref 3.5–5.1)
Sodium: 138 mmol/L (ref 136–145)

## 2018-01-08 LAB — HCG, QUANTITATIVE, PREGNANCY: hCG Quant: <5 m[IU]/mL (ref <5.0)

## 2018-01-08 LAB — CBC
Hematocrit: 25.7 % — ABNORMAL LOW (ref 36.0–48.0)
Hemoglobin: 7.6 g/dL — ABNORMAL LOW (ref 12.0–16.0)
MCH: 18.1 pg — ABNORMAL LOW (ref 26.0–34.0)
MCHC: 29.6 g/dL — ABNORMAL LOW (ref 31.0–36.0)
MCV: 61.1 fL — ABNORMAL LOW (ref 80.0–100.0)
MPV: 7.8 fL (ref 5.0–10.5)
PLATELET SLIDE REVIEW: INCREASED
Platelets: 457 10*3/uL — ABNORMAL HIGH (ref 135–450)
RBC: 4.2 M/uL (ref 4.00–5.20)
RDW: 19.6 % — ABNORMAL HIGH (ref 12.4–15.4)
WBC: 4.5 10*3/uL (ref 4.0–11.0)

## 2018-01-08 LAB — TYPE AND SCREEN
ABO/Rh: B POS
Antibody Screen: NEGATIVE

## 2018-01-08 LAB — PERIPHERAL BLOOD SMEAR, PATH REVIEW

## 2018-01-08 NOTE — Telephone Encounter (Signed)
Please call patient at [phone] re: FMLA paperwork.  Patient states that paperwork and information was given to Clydie BraunKaren.  Letter to employer was to be sent but they have not received it.  It was explained to patient that FMLA paperwork would be fiiled out the day of her surgery.

## 2018-01-09 NOTE — Telephone Encounter (Signed)
This was done and faxed and scanned into her chart today, called patient (this was the [employer] short term form) patient advised DONE

## 2018-01-10 MED ORDER — FERROUS SULFATE 325 (65 FE) MG PO TABS
325 (65 Fe) MG | ORAL_TABLET | Freq: Every day | ORAL | 5 refills | Status: DC
Start: 2018-01-10 — End: 2019-09-19

## 2018-01-16 NOTE — Progress Notes (Signed)
Phone message left to call PAT dept at [phone]  for history review and surgery instructions.

## 2018-01-16 NOTE — Progress Notes (Signed)
C-Difficile admission screening and protocol:     * Admitted with diarrhea?  YES____    NO__X__     *Prior history of C-Diff. In last 3 months?YES____   NO__X__     *Antibiotic use in the past 6-8 weeks? NO______YES___X--HPV infection___                 If yes which  ANTIBIOTIC AND REASON______     *Prior hospitalization or nursing home in the last month? YES____   NO__X__

## 2018-01-16 NOTE — Progress Notes (Signed)
[facility] PRE-OPERATIVE INSTRUCTIONS    Arrival time____0700________        Surgery time________0900____    Take the following medications with a sip of water:  Buspar and paxil    Do not eat or drink anything after 12:00 midnight prior to your surgery.  This includes water chewing gum, mints and ice chips.   You may brush your teeth and gargle the morning of your surgery, but do not swallow the water     Please see your family doctor/pediatrician for a history and physical and/or concerning medications.   Bring any test results/reports from your physicians office.   If you are under the care of a heart doctor or specialist doctor, please be aware that you may be asked to them for clearance    You may be asked to stop blood thinners such as Coumadin, Plavix, Fragmin, Lovenox, etc., or any anti-inflammatories such as:  Aspirin, Ibuprofen, Advil, Naproxen prior to your surgery.    We also ask that you stop any OTC medications such as fish oil, vitamin E, glucosamine, garlic, Multivitamins, COQ 10, etc.    We ask that you do not smoke 24 hours prior to surgery  We ask that you do not  drink any alcoholic beverages 24 hours prior to surgery     You must make arrangements for a responsible adult to take you home after your surgery.    For your safety you will not be allowed to leave alone or drive yourself home.  Your surgery will be cancelled if you do not have a ride home.     Also for your safety, it is strongly suggested that someone stay with you the first 24 hours after your surgery.     A parent or legal guardian must accompany a child scheduled for surgery and plan to stay at the hospital until the child is discharged.    Please do not bring other children with you.    For your comfort, please wear simple loose fitting clothing to the hospital.  Please do not bring valuables.    Do not wear any make-up or nail polish on your fingers or toes      For your safety, please do not wear any jewelry or body  piercing's on the day of surgery.   All jewelry must be removed.      If you have dentures, they will be removed before going to operating room.    For your convenience, we will provide you with a container.    If you wear contact lenses or glasses, they will be removed, please bring a case for them.     If you have a living will and a durable power of attorney for healthcare, please bring in a copy.     As part of our patient safety program to minimize surgical site infections, we ask you to do the following:     Please notify your surgeon if you develop any illness between         now and the  day of your surgery.     This includes a cough, cold, fever, sore throat, nausea,         or vomiting, and diarrhea, etc.    Please notify your surgeon if you experience dizziness, shortness         of breath or blurred vision between now and the time of your surgery.      Do not shave your operative  site 96 hours prior to surgery.   For face and neck surgery, men may use an electric razor 48 hours   prior to surgery.    You may shower the night before surgery or the morning of   your surgery with an antibacterial soap.    You will need to bring a photo ID and insurance card    [facility] has an onsite pharmacy, would you like to utilize our pharmacy     If you will be staying overnight and use a C-pap machine, please bring   your C-pap to hospital     Our goal is to provide you with excellent care, therefore, visitors will be limited to two(2) in the room at a time so that we may focus on providing this care for you.          Please contact pre-admission testing if you have any further questions.                 [facility] phone number:  [facility] fax number:  [phone]  Please note these are generalized instructions for all surgical cases, you may be provided with more specific instructions according to your surgery.

## 2018-01-17 NOTE — Telephone Encounter (Signed)
From: Kurtis Bushmanegina Milone  To: Estill Battenaroline J Bohme, MD  Sent: 01/17/2018 11:06 AM EST  Subject: Non-Urgent Medical Question    Hey Ms. Clydie BraunKaren or Dr.Bohme. So I have a question about getting prepared about my procedure. I have long acrylic nails and I was told no nail polish but in general will my nails be an issue? The reason I ask because I never had to remove my nails for any previous surgeries before. I am very curious.

## 2018-01-19 ENCOUNTER — Encounter: Attending: Obstetrics & Gynecology | Primary: Internal Medicine

## 2018-01-19 ENCOUNTER — Inpatient Hospital Stay: Payer: PRIVATE HEALTH INSURANCE

## 2018-01-19 LAB — PREPARE RBC (CROSSMATCH): Dispense Status Blood Bank: TRANSFUSED

## 2018-01-19 LAB — POC PREGNANCY UR-QUAL: Pregnancy, Urine: NEGATIVE

## 2018-01-19 LAB — TYPE AND SCREEN
ABO/Rh: B POS
Antibody Screen: NEGATIVE

## 2018-01-19 MED ORDER — DEXAMETHASONE SODIUM PHOSPHATE 20 MG/5ML IJ SOLN
20 | INTRAMUSCULAR | Status: AC
Start: 2018-01-19 — End: 2018-01-19

## 2018-01-19 MED ORDER — SODIUM CHLORIDE 0.9 % IV SOLN
0.9 % | INTRAVENOUS | Status: DC
Start: 2018-01-19 — End: 2018-01-19
  Administered 2018-01-19 (×2): via INTRAVENOUS

## 2018-01-19 MED ORDER — FAMOTIDINE 20 MG/2ML IV SOLN
20 MG/2ML | Freq: Once | INTRAVENOUS | Status: AC
Start: 2018-01-19 — End: 2018-01-19
  Administered 2018-01-19: 13:00:00 20 mg via INTRAVENOUS

## 2018-01-19 MED ORDER — ROCURONIUM BROMIDE 50 MG/5ML IV SOLN
50 | INTRAVENOUS | Status: AC
Start: 2018-01-19 — End: 2018-01-19

## 2018-01-19 MED ORDER — MIDAZOLAM HCL 2 MG/2ML IJ SOLN
2 MG/ML | INTRAMUSCULAR | Status: DC | PRN
Start: 2018-01-19 — End: 2018-01-19
  Administered 2018-01-19: 13:00:00 2 via INTRAVENOUS

## 2018-01-19 MED ORDER — NORMAL SALINE FLUSH 0.9 % IV SOLN
0.9 % | Freq: Two times a day (BID) | INTRAVENOUS | Status: DC
Start: 2018-01-19 — End: 2018-01-19

## 2018-01-19 MED ORDER — PROPOFOL 200 MG/20ML IV EMUL
200 MG/20ML | INTRAVENOUS | Status: DC | PRN
Start: 2018-01-19 — End: 2018-01-19
  Administered 2018-01-19: 14:00:00 160 via INTRAVENOUS

## 2018-01-19 MED ORDER — LIDOCAINE HCL (PF) 2 % IJ SOLN
2 % | INTRAMUSCULAR | Status: DC | PRN
Start: 2018-01-19 — End: 2018-01-19
  Administered 2018-01-19: 14:00:00 80 via INTRAVENOUS

## 2018-01-19 MED ORDER — MONSELS FERRIC SUBSULFATE EX SOLN
Freq: Once | CUTANEOUS | Status: AC | PRN
Start: 2018-01-19 — End: 2018-01-19
  Administered 2018-01-19: 17:00:00 5 via TOPICAL

## 2018-01-19 MED ORDER — LIDOCAINE HCL (PF) 2 % IJ SOLN
2 | INTRAMUSCULAR | Status: AC
Start: 2018-01-19 — End: 2018-01-19

## 2018-01-19 MED ORDER — EVICEL 5 ML EX KIT
5 ML | Freq: Once | CUTANEOUS | Status: AC | PRN
Start: 2018-01-19 — End: 2018-01-19
  Administered 2018-01-19: 15:00:00 5 via CUTANEOUS

## 2018-01-19 MED ORDER — DEXAMETHASONE SODIUM PHOSPHATE 20 MG/5ML IJ SOLN
20 MG/5ML | INTRAMUSCULAR | Status: DC | PRN
Start: 2018-01-19 — End: 2018-01-19
  Administered 2018-01-19: 14:00:00 8 via INTRAVENOUS

## 2018-01-19 MED ORDER — SUGAMMADEX SODIUM 200 MG/2ML IV SOLN
200 | INTRAVENOUS | Status: AC
Start: 2018-01-19 — End: 2018-01-19

## 2018-01-19 MED ORDER — ONDANSETRON HCL 4 MG/2ML IJ SOLN
4 | INTRAMUSCULAR | Status: AC
Start: 2018-01-19 — End: 2018-01-19

## 2018-01-19 MED ORDER — BISACODYL 10 MG RE SUPP
10 MG | Freq: Every day | RECTAL | Status: DC
Start: 2018-01-19 — End: 2018-01-19

## 2018-01-19 MED ORDER — CEFAZOLIN 2000 MG IN D5W 100 ML IVPB
Freq: Once | Status: AC
Start: 2018-01-19 — End: 2018-01-19
  Administered 2018-01-19: 14:00:00 2 g via INTRAVENOUS

## 2018-01-19 MED ORDER — SUCCINYLCHOLINE CHLORIDE 200 MG/10ML IV SOSY
200 | INTRAVENOUS | Status: AC
Start: 2018-01-19 — End: 2018-01-19

## 2018-01-19 MED ORDER — HYDROMORPHONE HCL 2 MG/ML IJ SOLN
2 MG/ML | INTRAMUSCULAR | Status: DC | PRN
Start: 2018-01-19 — End: 2018-01-19
  Administered 2018-01-19 (×2): 0.5 via INTRAVENOUS

## 2018-01-19 MED ORDER — BUPIVACAINE HCL (PF) 0.25 % IJ SOLN
0.25 % | Freq: Once | INTRAMUSCULAR | Status: AC | PRN
Start: 2018-01-19 — End: 2018-01-19
  Administered 2018-01-19: 15:00:00 13 via INTRAMUSCULAR

## 2018-01-19 MED ORDER — ONDANSETRON HCL 4 MG/2ML IJ SOLN
4 MG/2ML | INTRAMUSCULAR | Status: DC | PRN
Start: 2018-01-19 — End: 2018-01-19
  Administered 2018-01-19: 16:00:00 4 via INTRAVENOUS

## 2018-01-19 MED ORDER — FENTANYL CITRATE (PF) 100 MCG/2ML IJ SOLN
100 MCG/2ML | INTRAMUSCULAR | Status: DC | PRN
Start: 2018-01-19 — End: 2018-01-19
  Administered 2018-01-19: 14:00:00 50 via INTRAVENOUS
  Administered 2018-01-19: 14:00:00 100 via INTRAVENOUS
  Administered 2018-01-19: 14:00:00 50 via INTRAVENOUS

## 2018-01-19 MED ORDER — PROPOFOL 200 MG/20ML IV EMUL
200 | INTRAVENOUS | Status: AC
Start: 2018-01-19 — End: 2018-01-19

## 2018-01-19 MED ORDER — ONDANSETRON HCL 4 MG/2ML IJ SOLN
4 MG/2ML | Freq: Once | INTRAMUSCULAR | Status: AC | PRN
Start: 2018-01-19 — End: 2018-01-19
  Administered 2018-01-19: 18:00:00 4 mg via INTRAVENOUS

## 2018-01-19 MED ORDER — FENTANYL CITRATE (PF) 100 MCG/2ML IJ SOLN
100 | INTRAMUSCULAR | Status: AC
Start: 2018-01-19 — End: 2018-01-19

## 2018-01-19 MED ORDER — FENTANYL CITRATE (PF) 100 MCG/2ML IJ SOLN
100 MCG/2ML | INTRAMUSCULAR | Status: DC | PRN
Start: 2018-01-19 — End: 2018-01-19
  Administered 2018-01-19 (×2): 25 ug via INTRAVENOUS

## 2018-01-19 MED ORDER — NORMAL SALINE FLUSH 0.9 % IV SOLN
0.9 % | INTRAVENOUS | Status: DC | PRN
Start: 2018-01-19 — End: 2018-01-19

## 2018-01-19 MED ORDER — KETOROLAC TROMETHAMINE 30 MG/ML IJ SOLN
30 MG/ML | INTRAMUSCULAR | Status: DC | PRN
Start: 2018-01-19 — End: 2018-01-19
  Administered 2018-01-19: 16:00:00 30 via INTRAVENOUS

## 2018-01-19 MED ORDER — SODIUM CHLORIDE 0.9 % IV SOLN
0.9 % | INTRAVENOUS | Status: AC | PRN
Start: 2018-01-19 — End: 2018-01-19
  Administered 2018-01-19: 15:00:00 0.04 via INTRAVENOUS

## 2018-01-19 MED ORDER — HYDROMORPHONE HCL 1 MG/ML IJ SOLN
1 | INTRAMUSCULAR | Status: AC
Start: 2018-01-19 — End: 2018-01-19

## 2018-01-19 MED ORDER — SUCCINYLCHOLINE CHLORIDE 20 MG/ML IJ SOLN
20 MG/ML | INTRAMUSCULAR | Status: DC | PRN
Start: 2018-01-19 — End: 2018-01-19
  Administered 2018-01-19: 14:00:00 120 via INTRAVENOUS

## 2018-01-19 MED ORDER — MONSELS FERRIC SUBSULFATE EX SOLN
CUTANEOUS | Status: AC
Start: 2018-01-19 — End: 2018-01-19

## 2018-01-19 MED ORDER — HEPARIN SODIUM (PORCINE) 5000 UNIT/ML IJ SOLN
5000 UNIT/ML | Freq: Once | INTRAMUSCULAR | Status: AC
Start: 2018-01-19 — End: 2018-01-19
  Administered 2018-01-19: 12:00:00 5000 [IU] via SUBCUTANEOUS

## 2018-01-19 MED ORDER — FENTANYL CITRATE (PF) 100 MCG/2ML IJ SOLN
100 MCG/2ML | INTRAMUSCULAR | Status: DC | PRN
Start: 2018-01-19 — End: 2018-01-19
  Administered 2018-01-19: 18:00:00 50 ug via INTRAVENOUS

## 2018-01-19 MED ORDER — METRONIDAZOLE IN NACL 5-0.79 MG/ML-% IV SOLN
5 mg/mL | Freq: Once | INTRAVENOUS | Status: AC
Start: 2018-01-19 — End: 2018-01-19
  Administered 2018-01-19: 13:00:00 500 mg via INTRAVENOUS

## 2018-01-19 MED ORDER — OXYCODONE-ACETAMINOPHEN 5-325 MG PO TABS
5-325 MG | ORAL | Status: DC | PRN
Start: 2018-01-19 — End: 2018-01-19
  Administered 2018-01-19: 20:00:00 1 via ORAL

## 2018-01-19 MED ORDER — OXYCODONE-ACETAMINOPHEN 5-325 MG PO TABS
5-325 MG | ORAL_TABLET | ORAL | 0 refills | Status: AC | PRN
Start: 2018-01-19 — End: 2018-01-26

## 2018-01-19 MED ORDER — SUGAMMADEX SODIUM 200 MG/2ML IV SOLN
200 MG/2ML | INTRAVENOUS | Status: DC | PRN
Start: 2018-01-19 — End: 2018-01-19
  Administered 2018-01-19: 16:00:00 200 via INTRAVENOUS

## 2018-01-19 MED ORDER — IODINE STRONG 5 % PO SOLN
5 % | Freq: Once | ORAL | Status: AC | PRN
Start: 2018-01-19 — End: 2018-01-19
  Administered 2018-01-19: 17:00:00 0.2 via TOPICAL

## 2018-01-19 MED ORDER — MIDAZOLAM HCL 2 MG/2ML IJ SOLN
2 | INTRAMUSCULAR | Status: AC
Start: 2018-01-19 — End: 2018-01-19

## 2018-01-19 MED ORDER — KETOROLAC TROMETHAMINE 30 MG/ML IJ SOLN
30 | INTRAMUSCULAR | Status: AC
Start: 2018-01-19 — End: 2018-01-19

## 2018-01-19 MED ORDER — LIDOCAINE-EPINEPHRINE 1 %-1:200000 IJ SOLN
1 | INTRAMUSCULAR | Status: AC
Start: 2018-01-19 — End: 2018-01-19

## 2018-01-19 MED ORDER — BUPIVACAINE HCL (PF) 0.25 % IJ SOLN
0.25 | INTRAMUSCULAR | Status: AC
Start: 2018-01-19 — End: 2018-01-19

## 2018-01-19 MED ORDER — ROCURONIUM BROMIDE 50 MG/5ML IV SOLN
50 MG/5ML | INTRAVENOUS | Status: DC | PRN
Start: 2018-01-19 — End: 2018-01-19
  Administered 2018-01-19: 14:00:00 10 via INTRAVENOUS
  Administered 2018-01-19: 14:00:00 30 via INTRAVENOUS
  Administered 2018-01-19: 15:00:00 10 via INTRAVENOUS

## 2018-01-19 MED FILL — SUCCINYLCHOLINE CHLORIDE 200 MG/10ML IV SOSY: 200 MG/10ML | INTRAVENOUS | Qty: 10

## 2018-01-19 MED FILL — OXYCODONE-ACETAMINOPHEN 5-325 MG PO TABS: 5-325 mg | ORAL | Qty: 1

## 2018-01-19 MED FILL — KETOROLAC TROMETHAMINE 30 MG/ML IJ SOLN: 30 mg/mL | INTRAMUSCULAR | Qty: 1

## 2018-01-19 MED FILL — HEPARIN SODIUM (PORCINE) 5000 UNIT/ML IJ SOLN: 5000 [IU]/mL | INTRAMUSCULAR | Qty: 1

## 2018-01-19 MED FILL — DEXAMETHASONE SODIUM PHOSPHATE 20 MG/5ML IJ SOLN: 20 MG/5ML | INTRAMUSCULAR | Qty: 5

## 2018-01-19 MED FILL — BRIDION 200 MG/2ML IV SOLN: 200 MG/2ML | INTRAVENOUS | Qty: 2

## 2018-01-19 MED FILL — METRONIDAZOLE IN NACL 5-0.79 MG/ML-% IV SOLN: 5 mg/mL | INTRAVENOUS | Qty: 100

## 2018-01-19 MED FILL — FAMOTIDINE 20 MG/2ML IV SOLN: 20 MG/2ML | INTRAVENOUS | Qty: 2

## 2018-01-19 MED FILL — HYDROMORPHONE HCL 1 MG/ML IJ SOLN: 1 mg/mL | INTRAMUSCULAR | Qty: 1

## 2018-01-19 MED FILL — ROCURONIUM BROMIDE 50 MG/5ML IV SOLN: 50 MG/5ML | INTRAVENOUS | Qty: 5

## 2018-01-19 MED FILL — BUPIVACAINE HCL (PF) 0.25 % IJ SOLN: 0.25 % | INTRAMUSCULAR | Qty: 30

## 2018-01-19 MED FILL — XYLOCAINE-MPF 2 % IJ SOLN: 2 % | INTRAMUSCULAR | Qty: 5

## 2018-01-19 MED FILL — CEFAZOLIN 2000 MG IN D5W 100 ML IVPB: Qty: 2

## 2018-01-19 MED FILL — ONDANSETRON HCL 4 MG/2ML IJ SOLN: 4 MG/2ML | INTRAMUSCULAR | Qty: 2

## 2018-01-19 MED FILL — FENTANYL CITRATE (PF) 100 MCG/2ML IJ SOLN: 100 MCG/2ML | INTRAMUSCULAR | Qty: 2

## 2018-01-19 MED FILL — MIDAZOLAM HCL 2 MG/2ML IJ SOLN: 2 mg/mL | INTRAMUSCULAR | Qty: 2

## 2018-01-19 MED FILL — XYLOCAINE-MPF/EPINEPHRINE 1 %-1:200000 IJ SOLN: 1 %-:200000 | INTRAMUSCULAR | Qty: 30

## 2018-01-19 MED FILL — DIPRIVAN 200 MG/20ML IV EMUL: 200 MG/20ML | INTRAVENOUS | Qty: 20

## 2018-01-19 MED FILL — MONSELS FERRIC SUBSULFATE EX SOLN: CUTANEOUS | Qty: 30

## 2018-01-19 NOTE — Anesthesia Post-Procedure Evaluation (Signed)
Department of Anesthesiology  Postprocedure Note    Patient: Heather BushmanRegina Sampson  MRN: [id number]  Birthdate: 16-May-1986  Date of evaluation: 01/19/2018  Time:  3:56 PM     Procedure Summary     Date:  01/19/18 Room / Location:  [facility] OR 10 / [facility]    Anesthesia Start:  0825 Anesthesia Stop:  1214    Procedure:  ROBOTIC ASSISTED MYOMECTOMY, BILATERAL PERATUBAL CYST REMOVAL, COLD KNIFE CONE BIOPSY (N/A ) Diagnosis:       Severe dysplasia of cervix      (MENORRHAGIA, DYSMENORRHEA, ANEMIA, FIBROID, SEVERE DYSPLASIA OF CERVIX)    Surgeon:  Estill Battenaroline J Bohme, MD Responsible Provider:  Hillery HunterJohn Puckett, MD    Anesthesia Type:  general ASA Status:  2          Anesthesia Type: general    Aldrete Phase I: Aldrete Score: 10    Aldrete Phase II: Aldrete Score: 10    Last vitals: Reviewed and per EMR flowsheets.     Vitals:    01/19/18 1420 01/19/18 1444 01/19/18 1513 01/19/18 1531   BP: 125/69 109/63  114/68   Pulse: 92 89  88   Resp: 19 18  18    Temp:  98.2 F (36.8 C)     TempSrc:  Temporal     SpO2: 94% 96% 97% 96%   Weight:       Height:         Anesthesia Post Evaluation    Patient location during evaluation: bedside  Patient participation: complete - patient participated  Level of consciousness: awake and alert  Airway patency: patent  Nausea & Vomiting: no nausea  Complications: no  Cardiovascular status: hemodynamically stable  Respiratory status: acceptable (cough, clear phlegm , ascultation demonsrates cta b)  Hydration status: euvolemic

## 2018-01-19 NOTE — Progress Notes (Addendum)
Moving extremities, eyes open, not follow commands or speak, nrm remains in place

## 2018-01-19 NOTE — Progress Notes (Signed)
Opens mouth to command, opa removed, coughed to command

## 2018-01-19 NOTE — Op Note (Signed)
[facility]           [address] [place], [place] [id number]                                OPERATIVE REPORT    PATIENT NAME: Heather BushmanGALLOWAY, Heather Sampson                    DOB:        08-21-1986  MED REC NO:   [id number]                          ROOM:  ACCOUNT NO:   [id number]                           ADMIT DATE: 01/19/2018  PROVIDER:     Benita Gutteraroline Ewel Lona, MD    DATE OF PROCEDURE:  01/19/2018    PREOPERATIVE DIAGNOSES:  Menorrhagia, dysmenorrhea, anemia, fibroid,  high-grade dysplasia of the cervix.    POSTOPERATIVE DIAGNOSES:  Menorrhagia, dysmenorrhea, anemia, fibroid,  high-grade dysplasia of the cervix.    OPERATIONS PERFORMED:  Robotic-assisted myomectomy, cold knife cone  biopsy.    SURGEON:  Benita Gutteraroline Corliss Coggeshall, MD    ANESTHESIA:  General.    FINDINGS:  Enlarged fibroid uterus.  The uterus with fibroid to  Pathology.  Normal ovaries, normal tubes, and normal uterus, otherwise.   Nonstaining area around the cervical os with cold knife cone biopsy to  Pathology.    EBL:  150 mL.    IV FLUIDS:  1000 mL.    COMPLICATIONS:  None.    The patient was taken to the recovery room in stable condition.    INDICATIONS AND CONSENT:  This is a 32 year old female who suffers from  menorrhagia, dysmenorrhea, anemia, and fibroids.  The patient's  hemoglobin was 7.8 prior to procedure.  The patient has had heavy  periods.  Decision was made to proceed with myomectomy.  The patient  was found to have a severe dysplasia on her  colposcopy, and it was important to proceed with cold knife cone biopsy  at the same time.  All the risks, benefits, and alternatives were  discussed with the patient including the risk of bleeding; blood  transfusion; infection; damage to the bowel, bladder, or other local  organs and need for secondary surgery; blood clots to lungs, legs,  pelvis after surgery; anesthesia risk; risk of preterm labor and  incompetent cervix after cold knife cone biopsy.  The patient  also  understands risk of blood transfusion is higher due to her preoperative  hemoglobin is so low, did discuss with the patient there was a high  likelihood I would give her 1 unit, and risks of HIV and hepatitis were  discussed with the patient.  The patient understood this and agreed to  the procedure.    OPERATIVE PROCEDURE:  The patient was taken to the operating room.  Her  general anesthesia was found to be adequate.  She was prepped and draped  in the normal sterile fashion in the dorsal lithotomy position.  The  bladder was emptied prior to the procedure.  A Hulka dilator was placed  in the uterus for uterine manipulation.    An 11-mm supraumbilical incision was made with a scalpel after injection  with 0.25% Marcaine, and the Veress needle was entered into  the  abdominopelvic cavity.  Pneumoperitoneum was created.  Low pressures  were noted upon insufflation.  The patient was placed in Trendelenburg.   The 11-mm trocar was placed.  Right and left 8-mm midabdominal incisions  were made with scalpel after injection with 0.25% Marcaine; 8-mm trocars  were placed.  A right upper quadrant incision was made with scalpel  after injection with 0.25% Marcaine.  An 8-mm trocar was placed.   Decision was made to dock the robot, and then I went to the robotic  console.  The uterus was enlarged with one big fibroid.  The ovaries and  tubes appeared normal and the uterus appeared normal, otherwise.   Decision was made to proceed with myomectomy.    Using the monopolar shears, the serosa of the uterus was entered, and  then the fibroid was carefully dissected away from the uterus and the  uterine muscle using the monopolar shears and the PK instrument, and  once the fibroid was removed from the uterus, decision was made to sew  this up.  This entered the uterine cavity, so the patient will need a  future C-section if gets pregnant.    The uterine cavity at the top was closed with interrupted stitches of 0  Vicryl, and  then the uterine muscle was closed with several interrupted  stitches of 0 Vicryl until the uterus was closed.  Then, the serosa of  the uterus was closed in a baseball stitch with 0 Vicryl as well.   Irrigation was performed.  Hemostasis was achieved, and then the fibroid  was placed in the Endobag and then carefully removed through the  supraumbilical incision.  The supraumbilical incision had to be extended  in order to fit the fibroid out through an approximately 3-cm incision.   Evicel was placed on the myomectomy site.  Pneumoperitoneum was  released.  The supraumbilical fascia was closed with 0 Vicryl, and all  incisions were closed with 4-0 Monocryl in a subcuticular fashion.   Steri-Strips and Mastisol were placed.  Decision was made then to  proceed with cone biopsy.    The Hulka was removed.  The cervix was stained with Lugol's solution.   Nonstaining area was seen around the cervical os.  A suture of 0 Vicryl  with figure-of-eight fashion was placed at 3 and 9 o'clock.  Conical  incision was made to remove the tissue.  An ECC was performed.  The base  of the cervix was cauterized with the rollerball.  Monsel's solution was  placed.  Hemostasis was achieved.  Decision was made to end the  procedure.  The patient tolerated the procedure well.  All sponge, lap,  and needle counts were correct x2.        Benita Gutter, MD    D: 01/24/2018 23:35:07       T: 01/25/2018 5:57:06     [id number]  Job#: [id number]     Doc#: [id number]    CC:

## 2018-01-19 NOTE — Discharge Instructions (Signed)
Today you had a robotic assisted myomectomy, bilateral paratubal cyst removal, cold knife cone biopsy    Please call if you have any  -Fever greater than 101.  -Foul smelling discharge  -Redness with incision    -With the laparoscopy, you may also experience back and shoulder pain, gas pain in abdomen.  -Gas X over the counter may help with this discomfort  -Call for any nausea and vomiting    Other instructions  -No driving while on Percocet  -No lifting greater than 5 pounds for 1-2 weeks.   -Nothing in vagina for 4 weeks- no intercourse  -Begin exercise slowly in 3-4 weeks.  -You will have brown, black vaginal discharge.    Benita Gutteraroline Avalyn Molino, MD  [address]  [place], [place] [id number]  [phone]    Make an appointment with me in 1-2 weeks.Call for any problems.

## 2018-01-19 NOTE — Progress Notes (Signed)
Pt tolerated po fluids and crackers.

## 2018-01-19 NOTE — Brief Op Note (Signed)
Brief Postoperative Note  ______________________________________________________________    Patient: Heather Sampson  Date of Birth: 06-12-1986  MRN: [id number]  Date of Procedure: 01/19/2018    Pre-Op Diagnosis: MENORRHAGIA, DYSMENORRHEA, ANEMIA, FIBROID, SEVERE DYSPLASIA OF CERVIX    Post-Op Diagnosis: Same, bilateral paratubal cyst removal       Procedure(s):  ROBOTIC ASSISTED MYOMECTOMY, bilateral paratubal cyst removal, COLD KNIFE CONE BIOPSY    Anesthesia: General    Surgeon(s):  Estill Battenaroline J Dima Mini, MD    Assistant:     Estimated Blood Loss (mL): 150 ml    Complications: None    Specimens:   ID Type Source Tests Collected by Time Destination   A : BILATERAL PERATUBAL CYSTS AND UTERINE FIBROID Tissue Tissue SURGICAL PATHOLOGY Estill Battenaroline J Bellarose Burtt, MD 01/19/2018 1010    B : CERVICAL CONE Genital Cervix SURGICAL PATHOLOGY Estill Battenaroline J Tripton Ned, MD 01/19/2018 1100    C : ENDOCERVICAL CURRETTINGS Genital Cervix SURGICAL PATHOLOGY Estill Battenaroline J Jahshua Bonito, MD 01/19/2018 1111        Implants:  * No implants in log *      Drains:   Urethral Catheter Intermittent 16 fr (Active)       Findings: uterus with enlarged fibroid, fibroid to pathology, cone and ecc to pathology,   Normal ovaries bilateral, bilateral paratubal cysts    Estill Battenaroline J Keyton Bhat, MD  Date: 01/19/2018  Time: 11:23 AM

## 2018-01-19 NOTE — Progress Notes (Signed)
Vaginal Sweep Documentation     Intraop skin prep sponge count correct, verified by E. SALTER-ROBB, RN and L. HARPER, SA .     Vaginal sweep performed by DR. BOHME at 1108. No foreign objects or vaginal tears noted.

## 2018-01-19 NOTE — Progress Notes (Signed)
Pt up to chair at this time, snack given.

## 2018-01-19 NOTE — Progress Notes (Signed)
Strong cough leads to mucous / emesis clear frothy sputm and c/o nausea, medicated

## 2018-01-19 NOTE — Progress Notes (Signed)
Pt c/o pain 9/10, pt given 1tab po percocet

## 2018-01-19 NOTE — Progress Notes (Signed)
Discharge instructions reviewed with pt/family, understanding verbalized.

## 2018-01-19 NOTE — Progress Notes (Signed)
Arrives to pacu sedate, vss, difficulty obtaining 02 saturation, shallow breathing, will monitor

## 2018-01-19 NOTE — Progress Notes (Signed)
Instructed and uses IS to 1750, 02 decreased to 3l, appears to rest/sleep at intervals

## 2018-01-19 NOTE — Progress Notes (Signed)
apneic breathing noted, begin ambu bag, opa in place, spo2 monitor on toe gives reading

## 2018-01-19 NOTE — Progress Notes (Signed)
Dr Yvette RackBohme called requested order to be placed for type and crossmatch for 1 unit PRBC have prepared and ready for surgery.  MD to talk with patient prior to surgery.  Electronically signed by Lillia Abedynthia Loraine  Maury Groninger, RN on 01/19/2018 at 7:52 AM

## 2018-01-19 NOTE — Progress Notes (Signed)
02 sats drop at rest/sleep, breath sounds clear inspiration, crackles on expiration, respirations easy, cough at intervals

## 2018-01-19 NOTE — H&P (Signed)
Patient is a 32 year old F G0 who presents for robotic assisted myomectomy for fibroids, menorrhagia and dysmenorrhea. Patient also with hgsil-for CKC.     Review of Systems: as above  General ROS: negative  Psychological ROS: negative  Ophthalmic ROS: negative  ENT ROS: negative  Allergy and Immunology ROS: negative  Hematological and Lymphatic ROS: negative  Endocrine ROS: negative  Breast ROS: negative  Respiratory ROS: negative  Cardiovascular ROS: negative  Gastrointestinal ROS: negative  Genito-Urinary ROS: as above  Musculoskeletal ROS: negative  Neurological ROS: negative  Dermatological ROS: negative    Date of Birth 1985/12/30  Past Medical History:   Diagnosis Date   . Abnormal Pap smear of cervix 12/19/2017    ascus and hpv+   . Anemia    . Anxiety and depression    . Contact lens/glasses fitting    . History of kidney stones 2016   . 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 Ovarian cyst      Past Surgical History:   Procedure Laterality Date   . EXTERNAL EAR SURGERY      "pit holes"--at age 32   . URETER STENT PLACEMENT Right 2016     OB History   Gravida Para Term Preterm AB Living   1       1     SAB TAB Ectopic Molar Multiple Live Births   1                # Outcome Date GA Lbr Len/2nd Weight Sex Delivery Anes PTL Lv   1 SAB 2018                Social History     Social History   . Marital status: Single     Spouse name: N/A   . Number of children: N/A   . Years of education: N/A     Occupational History   . Not on file.     Social History Main Topics   . Smoking status: Former Smoker     Packs/day: 0.25     Years: 1.00     Types: Cigars     Start date: 12/05/2014     Quit date: 12/04/2017   . Smokeless tobacco: Never Used      Comment: 1 black and mild/day, smoking cessation 11/18   . Alcohol use Yes      Comment: 1 drink/month   . Drug use: No   . Sexual activity: Not Currently     Other Topics Concern   . Not on file     Social History Narrative   . No narrative on file      No Known Allergies  Outpatient Prescriptions Marked as Taking for the 01/19/18 encounter [facility](Hospital Encounter)   Medication Sig Dispense Refill   . Probiotic Product (PROBIOTIC-10) CAPS Take by mouth daily     . ferrous sulfate 325 (65 Fe) MG tablet Take 1 tablet by mouth daily (with breakfast) 30 tablet 5   . PARoxetine (PAXIL) 10 MG tablet TAKE 1 TABLET BY MOUTH EVERY MORNING 30 tablet 0   . busPIRone (BUSPAR) 7.5 MG tablet Take 1 tablet by mouth 3 times daily (Patient taking differently: Take 7.5 mg by mouth 3 times daily as needed ) 30 tablet 0     Family History   Problem Relation Age of Onset   . No Known Problems Mother    . No  Known Problems Father      BP 124/76   Pulse 75   Temp 98.9 F (37.2 C) (Oral)   Resp 16   Ht 5\' 5"  (1.651 m)   Wt 170 lb 10.2 oz (77.4 kg)   LMP 01/05/2018 (Approximate)   SpO2 99%   BMI 28.40 kg/m     WDWN in NAD  A and O x 3  HEENT-EOMI, mmm  CAR-RRR  Lungs-CTA  ABD-soft, NT, ND, no hsm  EXT-no c/c/e, no cords, NT  Neuro-grossly intact  PV-nl efg, Normal urethral meatus, nl urethra, nl bladder., nl cervix, nl vagina, enlarged uterus, NT. Nl adnexa with no masses, NT.    HB 7.8    Plan-patient is a 32 year old F with fibroid, menorrhagia, dysmenorrhea and anemia. Patient for robotic assisted myomectomy, possible laparotomy and cold knife cone biopsy. All the risks, benefits alternatives discussed with patient including bleeding, blood transfusion, infection, damage to the bowel, bladder, other local organs with need for secondary surgery, anesthesia risks, blood clots in the lungs, legs, pelvis after surgery. Patient understands these risks and agrees to the procedure. Patient also understands there may be other unforseen risks. Risks of PTL and incompetent cervix also discussed with patient.  Possible need for laparotomy. Patient with anemia-understands one unit PRBC possibly to be given in OR given pre-op HB. Will set patient up with hematology as outpatient for iron  transfusions.

## 2018-01-19 NOTE — Anesthesia Pre-Procedure Evaluation (Signed)
Department of Anesthesiology  Preprocedure Note       Name:  Heather BushmanRegina Sampson   Age:  32 y.o.  DOB:  07/23/1986                                          MRN:  [id number]         Date:  01/19/2018      Surgeon: Moishe SpiceSurgeon(s):  Estill Battenaroline J Bohme, MD    Procedure: ROBOTIC ASSISTED MYOMECTOMY, POSSIBLE LAPAROTOMY, COLD KNIFE CONE BIOPSY (N/A )    Medications prior to admission:   Prior to Admission medications    Medication Sig Start Date End Date Taking? Authorizing Provider   Probiotic Product (PROBIOTIC-10) CAPS Take by mouth daily   Yes Historical Provider, MD   ferrous sulfate 325 (65 Fe) MG tablet Take 1 tablet by mouth daily (with breakfast) 01/10/18  Yes Estill Battenaroline J Bohme, MD   PARoxetine (PAXIL) 10 MG tablet TAKE 1 TABLET BY MOUTH EVERY MORNING 01/02/18  Yes Baruch GoldmannPeng E Wang, DO   busPIRone (BUSPAR) 7.5 MG tablet Take 1 tablet by mouth 3 times daily  Patient taking differently: Take 7.5 mg by mouth 3 times daily as needed  10/31/17  Yes Baruch GoldmannPeng E Wang, DO       Current medications:    Current Facility-Administered Medications   Medication Dose Route Frequency Provider Last Rate Last Dose   . ceFAZolin (ANCEF) 2 g in dextrose 5 % 100 mL IVPB  2 g Intravenous Once Estill Battenaroline J Bohme, MD       . metronidazole (FLAGYL) 500 mg in NaCl 100 mL IVPB premix  500 mg Intravenous Once Estill Battenaroline J Bohme, MD       . 0.9 % sodium chloride infusion   Intravenous Continuous Tonie GriffithWilliam Peter Beckmeyer, MD 75 mL/hr at 01/19/18 0730     . sodium chloride flush 0.9 % injection 10 mL  10 mL Intravenous 2 times per day Tonie GriffithWilliam Peter Beckmeyer, MD       . sodium chloride flush 0.9 % injection 10 mL  10 mL Intravenous PRN Tonie GriffithWilliam Peter Beckmeyer, MD           Allergies:  No Known Allergies    Problem List:    Patient Active Problem List   Diagnosis Code   . Acute pyelonephritis N10   . Bacteremia R78.81   . History of nephrolithiasis Z87.442   . Depression with anxiety F41.8       Past Medical History:        Diagnosis Date   . Abnormal Pap smear of cervix  12/19/2017    ascus and hpv+   . Anemia    . Anxiety and depression    . Contact lens/glasses fitting    . History of kidney stones 2016   . 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 Ovarian cyst        Past Surgical History:        Procedure Laterality Date   . EXTERNAL EAR SURGERY      "pit holes"--at age 79   . URETER STENT PLACEMENT Right 2016       Social History:    Social History   Substance Use Topics   . Smoking status: Former Smoker     Packs/day: 0.25     Years: 1.00  Types: Cigars     Start date: 12/05/2014     Quit date: 12/04/2017   . Smokeless tobacco: Never Used      Comment: 1 black and mild/day, smoking cessation 11/18   . Alcohol use Yes      Comment: 1 drink/month                                Counseling given: Not Answered      Vital Signs (Current):   Vitals:    01/16/18 1656 01/19/18 0709 01/19/18 0732   BP:   124/76   Pulse:   75   Resp:   16   Temp:   98.9 F (37.2 C)   TempSrc:   Oral   SpO2:   99%   Weight: 174 lb (78.9 kg) 170 lb 10.2 oz (77.4 kg)    Height: 5\' 5"  (1.651 m) 5\' 5"  (1.651 m)                                               BP Readings from Last 3 Encounters:   01/19/18 124/76   01/01/18 122/74   12/19/17 124/78       NPO Status:  >8h                                                                               BMI:   Wt Readings from Last 3 Encounters:   01/19/18 170 lb 10.2 oz (77.4 kg)   01/01/18 174 lb (78.9 kg)   12/19/17 171 lb 9.6 oz (77.8 kg)     Body mass index is 28.4 kg/m.    CBC:   Lab Results   Component Value Date    WBC 4.5 01/08/2018    RBC 4.20 01/08/2018    HGB 7.6 01/08/2018    HCT 25.7 01/08/2018    MCV 61.1 01/08/2018    RDW 19.6 01/08/2018    PLT 457 01/08/2018       CMP:   Lab Results   Component Value Date    NA 138 01/08/2018    K 4.5 01/08/2018    CL 104 01/08/2018    CO2 23 01/08/2018    BUN 7 01/08/2018    CREATININE <0.5 01/08/2018    GFRAA >60 01/08/2018    AGRATIO 1.4 09/05/2015    LABGLOM >60 01/08/2018    GLUCOSE  99 01/08/2018    PROT 7.6 09/05/2015    CALCIUM 9.2 01/08/2018    BILITOT 0.6 09/05/2015    ALKPHOS 68 09/05/2015    AST 17 09/05/2015    ALT 8 09/05/2015       POC Tests: No results for input(s): POCGLU, POCNA, POCK, POCCL, POCBUN, POCHEMO, POCHCT in the last 72 hours.    Coags: No results found for: PROTIME, INR, APTT    HCG (If Applicable):   Lab Results   Component Value Date    PREGTESTUR neg 01/01/2018        ABGs: No results found  for: PHART, PO2ART, PCO2ART, HCO3ART, BEART, O2SATART     Type & Screen (If Applicable):  No results found for: LABABO, LABRH    Anesthesia Evaluation  Patient summary reviewed no history of anesthetic complications:   Airway: Mallampati: II  TM distance: >3 FB   Neck ROM: full  Mouth opening: > = 3 FB Dental:          Pulmonary:Negative Pulmonary ROS breath sounds clear to auscultation      (-) COPD and asthma                           Cardiovascular:  Exercise tolerance: good (>4 METS),       (-) hypertension and CABG/stent      Rhythm: regular  Rate: normal                    Neuro/Psych:   (+) psychiatric history: stable with treatmentdepression/anxiety             GI/Hepatic/Renal:   (+) renal disease: kidney stones,           Endo/Other:    (+) blood dyscrasia: anemia:., .                 Abdominal:           Vascular: negative vascular ROS.                                       Anesthesia Plan      general     ASA 2       Induction: intravenous.    MIPS: Postoperative opioids intended and Prophylactic antiemetics administered.  Anesthetic plan and risks discussed with patient.      Plan discussed with CRNA.            DOS STAFF ADDENDUM:    Pt seen and examined, chart reviewed (including anesthesia, drug and allergy history).  No interval changes to history and physical examination.  Anesthetic plan, risks, benefits, alternatives, and personnel involved discussed with patient.  Patient verbalized an understanding and agrees to proceed.      Hillery Hunter, MD  January 19, 2018  7:37 AM          Hillery Hunter, MD   01/19/2018

## 2018-01-19 NOTE — Progress Notes (Signed)
Alert and oriented. Agreeable to procedure. Consent signed by pt.  Electronically signed by Lillia Abedynthia Loraine  Lucio Litsey, RN on 01/19/2018 at 7:34 AM

## 2018-01-19 NOTE — Progress Notes (Signed)
Restless, alert, states pain level 10, medicate at small intervals, monitoring sp02

## 2018-01-19 NOTE — Progress Notes (Signed)
Uses is at intervals and rests/sleeps 02 sats improved at rest

## 2018-01-19 NOTE — Progress Notes (Signed)
Pt arrived to phase 2 sleepy, vss, pt O2 sats will drop to upper 80's when she sleeps, increased to low to mid 90's when reminded to take deep breaths.

## 2018-01-19 NOTE — Progress Notes (Signed)
Dr Joneen CarawayBeckmeyer sees pt at bedside, aware 02 sats decrease at intervals, using is and productive cough, 02 for transfer phase 2 - oob to chair

## 2018-01-19 NOTE — Progress Notes (Signed)
suctioned small amt, clear secretions

## 2018-01-19 NOTE — Progress Notes (Addendum)
100% NRM jaw thrust, pt with self and deep breathing

## 2018-01-22 LAB — CBC
Hematocrit: 26.4 % — ABNORMAL LOW (ref 36.0–48.0)
Hemoglobin: 7.8 g/dL — ABNORMAL LOW (ref 12.0–16.0)
MCH: 18.6 pg — ABNORMAL LOW (ref 26.0–34.0)
MCHC: 29.5 g/dL — ABNORMAL LOW (ref 31.0–36.0)
MCV: 62.8 fL — ABNORMAL LOW (ref 80.0–100.0)
MPV: 7.8 fL (ref 5.0–10.5)
Platelets: 518 10*3/uL — ABNORMAL HIGH (ref 135–450)
RBC: 4.2 M/uL (ref 4.00–5.20)
RDW: 20.6 % — ABNORMAL HIGH (ref 12.4–15.4)
WBC: 6.3 10*3/uL (ref 4.0–11.0)

## 2018-01-23 ENCOUNTER — Ambulatory Visit
Admit: 2018-01-23 | Discharge: 2018-01-23 | Payer: PRIVATE HEALTH INSURANCE | Attending: Internal Medicine | Primary: Internal Medicine

## 2018-01-23 ENCOUNTER — Encounter

## 2018-01-23 DIAGNOSIS — R109 Unspecified abdominal pain: Secondary | ICD-10-CM

## 2018-01-23 LAB — CBC WITH AUTO DIFFERENTIAL
Basophils %: 0.2 %
Basophils Absolute: 0 10*3/uL (ref 0.0–0.2)
Eosinophils %: 1.5 %
Eosinophils Absolute: 0.1 10*3/uL (ref 0.0–0.6)
Hematocrit: 30.8 % — ABNORMAL LOW (ref 36.0–48.0)
Hemoglobin: 9 g/dL — ABNORMAL LOW (ref 12.0–16.0)
Lymphocytes %: 28.2 %
Lymphocytes Absolute: 2 10*3/uL (ref 1.0–5.1)
MCH: 20 pg — ABNORMAL LOW (ref 26.0–34.0)
MCHC: 29.2 g/dL — ABNORMAL LOW (ref 31.0–36.0)
MCV: 68.3 fL — ABNORMAL LOW (ref 80.0–100.0)
MPV: 8.1 fL (ref 5.0–10.5)
Monocytes %: 4.7 %
Monocytes Absolute: 0.3 10*3/uL (ref 0.0–1.3)
Neutrophils %: 65.4 %
Neutrophils Absolute: 4.7 10*3/uL (ref 1.7–7.7)
Platelets: 446 10*3/uL (ref 135–450)
RBC: 4.51 M/uL (ref 4.00–5.20)
RDW: 29.6 % — ABNORMAL HIGH (ref 12.4–15.4)
WBC: 7.2 10*3/uL (ref 4.0–11.0)

## 2018-01-23 MED ORDER — SENNOSIDES-DOCUSATE SODIUM 8.6-50 MG PO TABS
ORAL_TABLET | Freq: Every day | ORAL | 0 refills | Status: DC
Start: 2018-01-23 — End: 2019-09-19

## 2018-01-23 MED ORDER — BISACODYL 10 MG RE SUPP
10 MG | Freq: Three times a day (TID) | RECTAL | 0 refills | Status: AC | PRN
Start: 2018-01-23 — End: 2018-02-22

## 2018-01-23 NOTE — Patient Instructions (Addendum)
Patient Education        Anxiety Disorder: Care Instructions  Your Care Instructions    Anxiety is a normal reaction to stress. Difficult situations can cause you to have symptoms such as sweaty palms and a nervous feeling.  In an anxiety disorder, the symptoms are far more severe. Constant worry, muscle tension, trouble sleeping, nausea and diarrhea, and other symptoms can make normal daily activities difficult or impossible. These symptoms may occur for no reason, and they can affect your work, school, or social life. Medicines, counseling, and self-care can all help.  Follow-up care is a key part of your treatment and safety. Be sure to make and go to all appointments, and call your doctor if you are having problems. It's also a good idea to know your test results and keep a list of the medicines you take.  How can you care for yourself at home?   Take medicines exactly as directed. Call your doctor if you think you are having a problem with your medicine.   Go to your counseling sessions and follow-up appointments.   Recognize and accept your anxiety. Then, when you are in a situation that makes you anxious, say to yourself, "This is not an emergency. I feel uncomfortable, but I am not in danger. I can keep going even if I feel anxious."   Be kind to your body:  ? Relieve tension with exercise or a massage.  ? Get enough rest.  ? Avoid alcohol, caffeine, nicotine, and illegal drugs. They can increase your anxiety level and cause sleep problems.  ? Learn and do relaxation techniques. See below for more about these techniques.   Engage your mind. Get out and do something you enjoy. Go to a funny movie, or take a walk or hike. Plan your day. Having too much or too little to do can make you anxious.   Keep a record of your symptoms. Discuss your fears with a good friend or family member, or join a support group for people with similar problems. Talking to others sometimes relieves stress.   Get involved in  social groups, or volunteer to help others. Being alone sometimes makes things seem worse than they are.   Get at least 30 minutes of exercise on most days of the week to relieve stress. Walking is a good choice. You also may want to do other activities, such as running, swimming, cycling, or playing tennis or team sports.  Relaxation techniques  Do relaxation exercises 10 to 20 minutes a day. You can play soothing, relaxing music while you do them, if you wish.   Tell others in your house that you are going to do your relaxation exercises. Ask them not to disturb you.   Find a comfortable place, away from all distractions and noise.   Lie down on your back, or sit with your back straight.   Focus on your breathing. Make it slow and steady.   Breathe in through your nose. Breathe out through either your nose or mouth.   Breathe deeply, filling up the area between your navel and your rib cage. Breathe so that your belly goes up and down.   Do not hold your breath.   Breathe like this for 5 to 10 minutes. Notice the feeling of calmness throughout your whole body.  As you continue to breathe slowly and deeply, relax by doing the following for another 5 to 10 minutes:   Tighten and relax each muscle group in   your body. You can begin at your toes and work your way up to your head.   Imagine your muscle groups relaxing and becoming heavy.   Empty your mind of all thoughts.   Let yourself relax more and more deeply.   Become aware of the state of calmness that surrounds you.   When your relaxation time is over, you can bring yourself back to alertness by moving your fingers and toes and then your hands and feet and then stretching and moving your entire body. Sometimes people fall asleep during relaxation, but they usually wake up shortly afterward.   Always give yourself time to return to full alertness before you drive a car or do anything that might cause an accident if you are not fully alert. Never  play a relaxation tape while you drive a car.  When should you call for help?  Call 911 anytime you think you may need emergency care. For example, call if:    You feel you cannot stop from hurting yourself or someone else.   Keep the numbers for these national suicide hotlines: 1-800-273-TALK 289-039-2185(1-905-535-2399) and 1-800-SUICIDE 931-821-9798(1-936-413-5671). If you or someone you know talks about suicide or feeling hopeless, get help right away.  Watch closely for changes in your health, and be sure to contact your doctor if:    You have anxiety or fear that affects your life.     You have symptoms of anxiety that are new or different from those you had before.   Where can you learn more?  Go to [email or web address] and sign in to your MyChart account. Enter P754 in the Search Health Information box to learn more about "Anxiety Disorder: Care Instructions."     If you do not have an account, please click on the "Sign Up Now" link.  Current as of: August 15, 2017  Content Version: 11.9   2006-2018 [employer]. Care instructions adapted under license by [facility]. If you have questions about a medical condition or this instruction, always ask your healthcare professional. [employer] disclaims any warranty or liability for your use of this information.         Patient Education        Constipation: Care Instructions  Your Care Instructions    Constipation means that you have a hard time passing stools (bowel movements). People pass stools from 3 times a day to once every 3 days. What is normal for you may be different. Constipation may occur with pain in the rectum and cramping. The pain may get worse when you try to pass stools. Sometimes there are small amounts of bright red blood on toilet paper or the surface of stools. This is because of enlarged veins near the rectum (hemorrhoids).  A few changes in your diet and lifestyle may help you avoid ongoing constipation.  Your doctor may also prescribe medicine to help loosen your stool.  Some medicines can cause constipation. These include pain medicines and antidepressants. Tell your doctor about all the medicines you take. Your doctor may want to make a medicine change to ease your symptoms.  Follow-up care is a key part of your treatment and safety. Be sure to make and go to all appointments, and call your doctor if you are having problems. It's also a good idea to know your test results and keep a list of the medicines you take.  How can you care for yourself at home?   Drink plenty of fluids, enough so that  your urine is light yellow or clear like water. If you have kidney, heart, or liver disease and have to limit fluids, talk with your doctor before you increase the amount of fluids you drink.   Include high-fiber foods in your diet each day. These include fruits, vegetables, beans, and whole grains.   Get at least 30 minutes of exercise on most days of the week. Walking is a good choice. You also may want to do other activities, such as running, swimming, cycling, or playing tennis or team sports.   Take a fiber supplement, such as Citrucel or Metamucil, every day. Read and follow all instructions on the label.   Schedule time each day for a bowel movement. A daily routine may help. Take your time having your bowel movement.   Support your feet with a small step stool when you sit on the toilet. This helps flex your hips and places your pelvis in a squatting position.   Your doctor may recommend an over-the-counter laxative to relieve your constipation. Examples are Milk of Magnesia and MiraLax. Read and follow all instructions on the label. Do not use laxatives on a long-term basis.  When should you call for help?  Call your doctor now or seek immediate medical care if:    You have new or worse belly pain.     You have new or worse nausea or vomiting.     You have blood in your stools.   Watch closely for  changes in your health, and be sure to contact your doctor if:    Your constipation is getting worse.     You do not get better as expected.   Where can you learn more?  Go to [email or web address] and sign in to your MyChart account. Enter P343 in the Search Health Information box to learn more about "Constipation: Care Instructions."     If you do not have an account, please click on the "Sign Up Now" link.  Current as of: August 27, 2017  Content Version: 11.9   2006-2018 [employer]. Care instructions adapted under license by [facility]. If you have questions about a medical condition or this instruction, always ask your healthcare professional. [employer] disclaims any warranty or liability for your use of this information.

## 2018-01-23 NOTE — Progress Notes (Signed)
PROGRESS NOTE:    Heather BushmanRegina Sampson    01/24/2018    Chief Complaint   Patient presents with   . Follow-up     Increased wheezing since recent surgical procedure done 01/19/2018.     HPI:    Mr(s). Heather BushmanRegina Sampson presents to clinic today with issues noted above.      She just had robotic abdominal surgery for myomectomy where there were firbroids and cysts removed.  She is having trouble "catching a breath" because of abdominal distention, especially with exertion but no lightheadness/dizziness/wheezing/CP.    Has not had a BM since the day before surgery 01/18/2018, 5 days.      She denies passing gas.    Patient is taking antidepressant/anxiolytics as prescribed, denies suicidal idealolgy/plan, crying out episodes, feels anxiety and depression is overall controlled.       Patient denies chest pain, SOB, NVD, FC, rash, malaise, rigor, dizziness/lightheadness, other pertinent ROS was also reviewed.          BP 112/74 (Site: Left Upper Arm, Position: Sitting, Cuff Size: Small Adult)   Pulse 68 Comment: Regular  Temp 98.9 F (37.2 C) (Oral)   Resp 16   Ht 5\' 6"  (1.676 m)   Wt 174 lb (78.9 kg)   LMP 01/05/2018 (Approximate)   SpO2 92% Comment: Room Air  BMI 28.08 kg/m   Body mass index is 28.08 kg/m.    No Known Allergies    Physical Exam:    Gen: Patient appears well groomed, well nourished appearing, seemed uncomfortable sitting on table due to abdominal distension  HEAD: Atraumatic, normocephalic,   Eyes: PERRLA, EOMI   Neck: supple, no thyroid nodule appreciated, no JVD  Chest: Clear to auscultation BIL, unlabored breathing, normal expansion  Heart: Regular rate, regular rhythm, no murmur, no rub  Abdomen: Non-tender, distended, bowel sounds present x3, surgical incisions intact  Extremities: no edema, distal pulses intact  Patient was alert and oriented to person, place and time    Assessment and Plan:    Heather KocherRegina was seen today for follow-up.    Diagnoses and all orders for this visit:    Abdominal  pressure  Exertional dyspnea  Obstipation  Discussed with her GYN, needs enema and get BM going, also senna    Iron deficiency anemia due to chronic blood loss  Recheck H/H  -     CBC WITH AUTO DIFFERENTIAL; Future    Depression with anxiety  Patient is taking antidepressant/anxiolytics as prescribed, denies suicidal idealolgy/plan, crying out episodes, feels anxiety and depression is overall controlled.       Other orders  -     bisacodyl (DULCOLAX) 10 MG suppository; Place 1 suppository rectally 3 times daily as needed for Constipation  -     senna-docusate (PERICOLACE) 8.6-50 MG per tablet; Take 2 tablets by mouth daily    Orders Placed This Encounter   Procedures   . CBC WITH AUTO DIFFERENTIAL       Prior to Admission medications    Medication Sig Start Date End Date Taking? Authorizing Provider   bisacodyl (DULCOLAX) 10 MG suppository Place 1 suppository rectally 3 times daily as needed for Constipation 01/23/18 02/22/18 Yes Baruch GoldmannPeng E Eleni Frank, DO   senna-docusate (PERICOLACE) 8.6-50 MG per tablet Take 2 tablets by mouth daily 01/23/18  Yes Baruch GoldmannPeng E Rayel Santizo, DO   oxyCODONE-acetaminophen (PERCOCET) 5-325 MG per tablet Take 1-2 tablets by mouth every 4 hours as needed for Pain for up to 7 days.. 01/19/18 01/26/18  Estill Batten, MD   Probiotic Product (PROBIOTIC-10) CAPS Take by mouth daily    Historical Provider, MD   ferrous sulfate 325 (65 Fe) MG tablet Take 1 tablet by mouth daily (with breakfast) 01/10/18   Estill Batten, MD   PARoxetine (PAXIL) 10 MG tablet TAKE 1 TABLET BY MOUTH EVERY MORNING 01/02/18   Baruch Goldmann, DO   busPIRone (BUSPAR) 7.5 MG tablet Take 1 tablet by mouth 3 times daily  Patient taking differently: Take 7.5 mg by mouth 3 times daily as needed  10/31/17   Baruch Goldmann, DO       Past Medical History:   Diagnosis Date   . Abnormal Pap smear of cervix 12/19/2017    ascus and hpv+   . Anemia    . Anxiety and depression    . Contact lens/glasses fitting    . History of kidney stones 2016   . 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 Ovarian cyst        Past Surgical History:   Procedure Laterality Date   . EXTERNAL EAR SURGERY      "pit holes"--at age 57   . MYOMECTOMY N/A 01/19/2018    ROBOTIC ASSISTED MYOMECTOMY, BILATERAL PERATUBAL CYST REMOVAL, COLD KNIFE CONE BIOPSY performed by Estill Batten, MD at [facility]   . OTHER SURGICAL HISTORY  01/19/2018    ROBOTIC ASSISTED MYOMECTOMY, bilateral paratubal cyst removal, COLD KNIFE CONE BIOPSY   . URETER STENT PLACEMENT Right 2016       Social History   Substance Use Topics   . Smoking status: Former Smoker     Packs/day: 0.25     Years: 1.00     Types: Cigars     Start date: 12/05/2014     Quit date: 12/04/2017   . Smokeless tobacco: Never Used      Comment: 1 black and mild/day, smoking cessation 11/18   . Alcohol use Yes      Comment: 1 drink/month       Family History   Problem Relation Age of Onset   . No Known Problems Mother    . No Known Problems Father

## 2018-02-01 ENCOUNTER — Ambulatory Visit
Admit: 2018-02-01 | Discharge: 2018-02-01 | Payer: PRIVATE HEALTH INSURANCE | Attending: Obstetrics & Gynecology | Primary: Internal Medicine

## 2018-02-01 DIAGNOSIS — N898 Other specified noninflammatory disorders of vagina: Secondary | ICD-10-CM

## 2018-02-01 MED ORDER — NYSTATIN 100000 UNIT/GM EX POWD
100000 | Freq: Two times a day (BID) | CUTANEOUS | 2 refills | 9.00000 days | Status: DC
Start: 2018-02-01 — End: 2019-09-19

## 2018-02-01 MED ORDER — NYSTATIN 100000 UNIT/GM EX POWD
100000 UNIT/GM | Freq: Two times a day (BID) | CUTANEOUS | 2 refills | Status: DC
Start: 2018-02-01 — End: 2018-02-01

## 2018-02-01 MED ORDER — FLUCONAZOLE 150 MG PO TABS
150 MG | ORAL_TABLET | Freq: Once | ORAL | 1 refills | Status: AC
Start: 2018-02-01 — End: 2018-02-01

## 2018-02-02 MED ORDER — PAROXETINE HCL 10 MG PO TABS
10 MG | ORAL_TABLET | Freq: Every morning | ORAL | 2 refills | Status: DC
Start: 2018-02-02 — End: 2019-09-19

## 2018-02-03 LAB — CULTURE, GENITAL: Genital Culture, Routine: NORMAL

## 2018-02-04 NOTE — Progress Notes (Signed)
Patient is s/p myomectomy and ckc. Patient with some vaginal discharge.     BP 118/73 (Site: Right Upper Arm, Position: Sitting, Cuff Size: Large Adult)   Pulse 81   Resp 17   Ht 5\' 6"  (1.676 m)   Wt 171 lb (77.6 kg)   LMP 01/05/2018 (Approximate)   BMI 27.60 kg/m     WDWN in NAD  A and O x 3  ABD-soft, NT, ND, no hsm  PV-nl efg, Normal urethral meatus, nl urethra, nl bladder., cervix healing well nl vagina scant discharge, nl uterus with no masses, NT. Nl adnexa with no masses, NT.    Plan-s/p robotic myomectomy, ckc  -vaginal discharge-cultures  -sitz bath

## 2018-02-15 ENCOUNTER — Encounter

## 2018-02-15 MED ORDER — OXYCODONE-ACETAMINOPHEN 5-325 MG PO TABS
5-325 MG | ORAL_TABLET | Freq: Four times a day (QID) | ORAL | 0 refills | Status: AC | PRN
Start: 2018-02-15 — End: 2018-02-22

## 2018-02-15 NOTE — Telephone Encounter (Signed)
Returning Dr. Marcie BalBohme's call re: patient's heavy bleeding.  Please call patient at [phone].

## 2018-02-15 NOTE — Telephone Encounter (Signed)
Called and spoke to patient earlier in day. Told her to start her provera. Told her to call if bleeding worsens. Feel like could be due to cycle so close to surgery.

## 2018-02-15 NOTE — Telephone Encounter (Signed)
From: Kurtis Bushmanegina Otwell  To: Estill Battenaroline J Bohme, MD  Sent: 02/14/2018 8:29 PM EDT  Subject: Prescription Question    Hi Dr. Yvette RackBohme. So I am having really sharp pain in abdomen uterus area. I came on my period yesterday (March 12) and I took the pain meds (diclofenac potassium) you prescribed to me before my surgery but that's not helping. So I took my last two pills of oxycodone I was prescribed after my surgery and that helped out a lot. Only thing now is I'm out of this med and no refill; is it anyway I can get this meds prescribed again for the pain? Another concern I am also bleeding heavy, is that normal being my first cycle after the surgery?

## 2018-02-22 NOTE — Telephone Encounter (Signed)
From: Kurtis Bushmanegina Shader  To: Estill Battenaroline J Bohme, MD  Sent: 02/22/2018 1:22 PM EDT  Subject: Non-Urgent Medical Question    Hey Dr.Bohme. I wanted to touch basis with you about returning back to work Thursday (March 28th). My job called to see if I could get a note about returning on the 28th and if I any restrictions before coming back. I was wondering is there by any chances I could come by the office to get a note stating this information. If so what would be a good day? I have a meeting wit them Tuesday (March 26).

## 2018-03-20 ENCOUNTER — Encounter

## 2018-03-20 ENCOUNTER — Ambulatory Visit
Admit: 2018-03-20 | Discharge: 2018-03-20 | Payer: PRIVATE HEALTH INSURANCE | Attending: Obstetrics & Gynecology | Primary: Internal Medicine

## 2018-03-20 DIAGNOSIS — Z9889 Other specified postprocedural states: Secondary | ICD-10-CM

## 2018-03-20 MED ORDER — TRIAMCINOLONE ACETONIDE 0.5 % EX CREA
0.5 % | Freq: Two times a day (BID) | CUTANEOUS | 2 refills | Status: AC
Start: 2018-03-20 — End: 2018-04-19

## 2018-03-20 NOTE — Progress Notes (Signed)
Patient is here for post op visit. Last period heavy but was 4 weeks post op. Feeling better now.     BP 121/68 (Site: Right Upper Arm, Position: Sitting, Cuff Size: Large Adult)    Pulse 86    Resp 17    Ht 5\' 6"  (1.676 m)    Wt 181 lb (82.1 kg)    LMP 02/23/2018    Breastfeeding? No    BMI 29.21 kg/m??     WDWN in NAD  A and O x 3  ABD-soft, NT, ND, no hsm, slightly raised incisions  PV-nl efg, Normal urethral meatus, nl urethra, nl bladder., nl cervix healing well, minimal blood, nl vagina, nl uterus with no masses, NT. Nl adnexa with no masses, NT.    Plan-s/p robotic myomectomy, ckc  -healing well  -last cycle might have been heavier due to close to post op. States better now. Told to call with heavier bleeding  RTC for her repeat pap  -anemia hx-check cbc

## 2018-03-21 LAB — CBC
Hematocrit: 38.9 % (ref 36.0–48.0)
Hemoglobin: 12.6 g/dL (ref 12.0–16.0)
MCH: 27.6 pg (ref 26.0–34.0)
MCHC: 32.5 g/dL (ref 31.0–36.0)
MCV: 84.9 fL (ref 80.0–100.0)
MPV: 8.8 fL (ref 5.0–10.5)
Platelets: 350 10*3/uL (ref 135–450)
RBC: 4.58 M/uL (ref 4.00–5.20)
RDW: 29.1 % — ABNORMAL HIGH (ref 12.4–15.4)
WBC: 7.6 10*3/uL (ref 4.0–11.0)

## 2018-03-21 LAB — FERRITIN: Ferritin: 24.6 ng/mL (ref 15.0–150.0)

## 2018-04-27 DIAGNOSIS — F329 Major depressive disorder, single episode, unspecified: Secondary | ICD-10-CM

## 2018-04-27 NOTE — ED Notes (Signed)
SW called Laqueta Jean, Friend, [phone], and LVM.

## 2018-04-27 NOTE — ED Triage Notes (Signed)
Sophia Pittman is a 32 y.o.  Black or [place] American female presents to [facility] [place] [employer] due to  suicidal ideation . Pt states that she was texting her friend and told her that she didn't feel like living anymore and wanted to jump off the [facility].. Pt states that she thinks about her GM who passed about three years ago. Pt denies HI and VH. Pt does endorse hearing her GMs voice and she wants to be with her. Admit to observation and treatment area . Safety checks started, belongings secured , oriented to unit, verbally reassured. Patient presents age appropriate, casually dressed and overweight , depressed , appropriate affect , impaired judgment  ,alert and oriented to person, place, time and situation. Patient reports the following stressors family, financial and health.

## 2018-04-28 ENCOUNTER — Inpatient Hospital Stay
Admit: 2018-04-28 | Discharge: 2018-04-28 | Disposition: A | Discharge status: Home / Self Care | Payer: PRIVATE HEALTH INSURANCE

## 2018-04-28 MED ORDER — FLUoxetine (PROZAC) 20 MG capsule
20 | ORAL_CAPSULE | Freq: Every day | ORAL | 0 refills | Status: AC
Start: 2018-04-28 — End: 2019-04-28

## 2018-04-28 MED ORDER — melatonin 3 mg Tab
3 | ORAL_TABLET | Freq: Every evening | ORAL | 0 refills | Status: AC
Start: 2018-04-28 — End: ?

## 2018-04-28 MED ORDER — hydrOXYzine pamoate (VISTARIL) 50 MG capsule
50 | ORAL_CAPSULE | ORAL | 0 refills | Status: AC | PRN
Start: 2018-04-28 — End: ?

## 2018-04-28 NOTE — ED Notes (Signed)
The patient was seen and evaluated by the physician.   A discharge order was written.   The patient currently denies suicidal and/or homicidal ideations.  The patient also denies any auditory and/or visual hallucinations.   The patient is without any signs and symptoms of distress and is currently without complaints.  The patient is ambulatory.  The patient was given all discharge paperwork and the discharge information was reviewed and explained to the patient.   The patient stated understanding of the discharge instructions. The patient left the OTA with all belongings.   Home medications  returned:   yes  Medication education completed:   yes  Prescriptions given to the patient:   yes  AVS signed and given to the patient:  yes  ID Band removed:   yes

## 2018-04-28 NOTE — ED Notes (Signed)
Sw met with pt's Lenny Pastel, [phone] and another friend, Benedetto Goad.  GF reports pt has been depressed for some while now.  Pt had moved here from [place] about 3 yrs ago.  Pt deals with childhood trauma and still dealing with her grandmother's passing back in 2016.  Pt had been talking crazy asking GF to take care of her cat.  Pt sees Dr. Deniece Portela at [facility].  Pt stopped taking meds about a month ago when she felt they were not working.  Since then pt has been self medicating with alcohol and it is unknown how much she is actually consuming.  Pt has no coping skills.  In the 2 yrs that GF has know pt, pt has not attempted any self harming behaviors however has made some SI statements.  Pt allegedly has no support in this area as all her family is in [place].  Pt works for [employer] in [place] for the past three years.  Has [employer] for insurance.  Pt is not engaged in therapy.  There are no weapons in the home.  Pt is not sleeping either.  GF reported that she called the police after receiving text messages from pt about no longer wanting to live so she called for a wellness check.      Pt will be getting discharged and will be going home with GF and this friend.

## 2018-04-28 NOTE — Unmapped (Signed)
Living With Depression  Everyone experiences occasional disappointment, sadness, and loss in their lives. When you are feeling down, blue, or sad for at least 2 weeks in a row, it may mean that you have depression. Depression can affect your thoughts and feelings, relationships, daily activities, and physical health. It is caused by changes in the way your brain functions. If you receive a diagnosis of depression, your health care provider will tell you which type of depression you have and what treatment options are available to you.  If you are living with depression, there are ways to help you recover from it and also ways to prevent it from coming back.  How to cope with lifestyle changes  Coping with stress  Stress is your body???s reaction to life changes and events, both good and bad. Stressful situations may include:  ?? Getting married.  ?? The death of a spouse.  ?? Losing a job.  ?? Retiring.  ?? Having a baby.  Stress can last just a few hours or it can be ongoing. Stress can play a major role in depression, so it is important to learn both how to cope with stress and how to think about it differently.  Talk with your health care provider or a counselor if you would like to learn more about stress reduction. He or she may suggest some stress reduction techniques, such as:  ?? Music therapy. This can include creating music or listening to music. Choose music that you enjoy and that inspires you.  ?? Mindfulness-based meditation. This kind of meditation can be done while sitting or walking. It involves being aware of your normal breaths, rather than trying to control your breathing.  ?? Centering prayer. This is a kind of meditation that involves focusing on a spiritual word or phrase. Choose a word, phrase, or sacred image that is meaningful to you and that brings you peace.  ?? Deep breathing. To do this, expand your stomach and inhale slowly through your nose. Hold your breath for 3-5 seconds, then exhale slowly,  allowing your stomach muscles to relax.  ?? Muscle relaxation. This involves intentionally tensing muscles then relaxing them.  Choose a stress reduction technique that fits your lifestyle and personality. Stress reduction techniques take time and practice to develop. Set aside 5-15 minutes a day to do them. Therapists can offer training in these techniques. The training may be covered by some insurance plans. Other things you can do to manage stress include:  ?? Keeping a stress diary. This can help you learn what triggers your stress and ways to control your response.  ?? Understanding what your limits are and saying no to requests or events that lead to a schedule that is too full.  ?? Thinking about how you respond to certain situations. You may not be able to control everything, but you can control how you react.  ?? Adding humor to your life by watching funny films or TV shows.  ?? Making time for activities that help you relax and not feeling guilty about spending your time this way.  Medicines  Your health care provider may suggest certain medicines if he or she feels that they will help improve your condition. Avoid using alcohol and other substances that may prevent your medicines from working properly (may interact). It is also important to:  ?? Talk with your pharmacist or health care provider about all the medicines that you take, their possible side effects, and what medicines are safe to   take together.  ?? Make it your goal to take part in all treatment decisions (shared decision-making). This includes giving input on the side effects of medicines. It is best if shared decision-making with your health care provider is part of your total treatment plan.  If your health care provider prescribes a medicine, you may not notice the full benefits of it for 4-8 weeks. Most people who are treated for depression need to be on medicine for at least 6-12 months after they feel better. If you are taking medicines as part  of your treatment, do not stop taking medicines without first talking to your health care provider. You may need to have the medicine slowly decreased (tapered) over time to decrease the risk of harmful side effects.  Relationships  Your health care provider may suggest family therapy along with individual therapy and drug therapy. While there may not be family problems that are causing you to feel depressed, it is still important to make sure your family learns as much as they can about your mental health. Having your family???s support can help make your treatment successful.  How to recognize changes in your condition  Everyone has a different response to treatment for depression. Recovery from major depression happens when you have not had signs of major depression for two months. This may mean that you will start to:  ?? Have more interest in doing activities.  ?? Feel less hopeless than you did 2 months ago.  ?? Have more energy.  ?? Overeat less often, or have better or improving appetite.  ?? Have better concentration.  Your health care provider will work with you to decide the next steps in your recovery. It is also important to recognize when your condition is getting worse. Watch for these signs:  ?? Having fatigue or low energy.  ?? Eating too much or too little.  ?? Sleeping too much or too little.  ?? Feeling restless, agitated, or hopeless.  ?? Having trouble concentrating or making decisions.  ?? Having unexplained physical complaints.  ?? Feeling irritable, angry, or aggressive.  Get help as soon as you or your family members notice these symptoms coming back.  How to get support and help from others  How to talk with friends and family members about your condition  Talking to friends and family members about your condition can provide you with one way to get support and guidance. Reach out to trusted friends or family members, explain your symptoms to them, and let them know that you are working with a health  care provider to treat your depression.  Financial resources  Not all insurance plans cover mental health care, so it is important to check with your insurance carrier. If paying for co-pays or counseling services is a problem, search for a local or county mental health care center. They may be able to offer public mental health care services at low or no cost when you are not able to see a private health care provider.  If you are taking medicine for depression, you may be able to get the generic form, which may be less expensive. Some makers of prescription medicines also offer help to patients who cannot afford the medicines they need.  Follow these instructions at home:  ?? Get the right amount and quality of sleep.  ?? Cut down on using caffeine, tobacco, alcohol, and other potentially harmful substances.  ?? Try to exercise, such as walking or lifting small weights.  ??   Take over-the-counter and prescription medicines only as told by your health care provider.  ?? Eat a healthy diet that includes plenty of vegetables, fruits, whole grains, low-fat dairy products, and lean protein. Do not eat a lot of foods that are high in solid fats, added sugars, or salt.  ?? Keep all follow-up visits as told by your health care provider. This is important.  Contact a health care provider if:  ?? You stop taking your antidepressant medicines, and you have any of these symptoms:  ?? Nausea.  ?? Headache.  ?? Feeling lightheaded.  ?? Chills and body aches.  ?? Not being able to sleep (insomnia).  ?? You or your friends and family think your depression is getting worse.  Get help right away if:  ?? You have thoughts of hurting yourself or others.  If you ever feel like you may hurt yourself or others, or have thoughts about taking your own life, get help right away. You can go to your nearest emergency department or call:  ?? Your local emergency services (911 in the [place]  ?? A suicide crisis helpline, such as the National Suicide Prevention  Lifeline at 1-800-273-8255. This is open 24-hours a day.  Summary  ?? If you are living with depression, there are ways to help you recover from it and also ways to prevent it from coming back.  ?? Work with your health care team to create a management plan that includes counseling, stress management techniques, and healthy lifestyle habits.  This information is not intended to replace advice given to you by your health care provider. Make sure you discuss any questions you have with your health care provider.  Document Released: 10/24/2016 Document Revised: 10/24/2016 Document Reviewed: 10/24/2016  Elsevier Interactive Patient Education ?? 2018 [employer]

## 2018-04-28 NOTE — ED Provider Notes (Signed)
[facility] Psychiatric Emergency  Service Evaluation    Reason for Visit/Chief Complaint: Suicidal (Admit to OTA)      Patient History     HPI   PES Presentation Per RN's Note   Sophia Pittman is a 32 y.o.  Black or [place] American female presents to PES BIB [place] [employer] due to  suicidal ideation . Pt states that she was texting her friend and told her that she didn't feel like living anymore and wanted to jump off the [facility].. Pt states that she thinks about her GM who passed about three years ago. Pt denies HI and VH. Pt does endorse hearing her GMs voice and she wants to be with her. Admit to observation and treatment area . Safety checks started, belongings secured , oriented to unit, verbally reassured. Patient presents age appropriate, casually dressed and overweight , depressed , appropriate affect , impaired judgment  ,alert and oriented to person, place, time and situation. Patient reports the following stressors family, financial and health.    Patient Interviewed, chart reviewed and case discussed with staff.   Sophia Pittman is a 32 yo AAF with a self reported Past Psychiatric History of Depression and GAD brought in by Police on a SOB in the context that patient was texting a friend and express a desired of not wanting to live any more with thoughts of jumping over the [facility]. Patient reports multiple stressors; financial/Family (under pressure to support family although she is struggling her self), Continues grieving of grandmothers who died 3 yrs ago.     She reports she was put on and antidepressant by PCP in November 2018. She stop taking it without consulting with PCP Dr. Regino Schultze about a month ago. She reports that she was having surgery and she was worried about all the meds she was taking for surgery interaction with the Antidepressant. Uncertain of what the antidepressant was. She reports he over thinks which makes her anxiety worse and subsequently her depression.     She  denies SI/HI, A/VH. She is relax and cooperative. She smile most of the interview. However, got a little emotional talking about her father who is not in her life.   Reports she is calm I feel good returning back home to my cat. She reports that she will like to be at work this morning at 630am all indications that she is feature oriented. She is interested in restarting medications and outpatient therapy. No mania, no delusions. She does not have access to weapons.                PES Triage Screening:  Broset score:             PSS- Safety Screen Score: 3  Suicide Screen: Is patient expressing suicidal ideations?: Yes    Is Admission due to self harm?: No    Has Patient Attempted Suicide or Self Harm in past 72 hours?: No    Is Patient experiencing acute agitation, anxiety or insomnia?  : No    Context: stress and nonadherence with meds  Location: Altered mental status of mood  Duration: 1 days.  Severity: moderate .  Associated Symptoms: moderate . Depression   Modifying Factors: medication noncompliance .  Timing: Constant and N/A.       Past Psychiatric History: Depression, Anxiety     Hospitalizations: no.    Past suicide attempts: yes - Cut once when she was 32 yo.    History of violence:  no.    Outpatient- she is followed by PCP Dr. Regino Schultze     Substance Use History: Patient reports occasional Etoh use but denies use of other illicit drugs     Collateral information.   Laqueta Jean, Friend, [phone] presented in the lobby. She expressed some concerns about patient but reports she will be safe returning home.          PMH: None        Past Medical History:   Diagnosis Date    Depression      I have reviewed the past medical history.  Additional history obtained: yes    Social History: Born in [place],. She has never been married and has no children. She has been working a Mudlogger for the past 2 yrs. Some college. She is not close to father. She denies any abuse     Work History:  employed    Social  History     Social History    Marital status: Single     Spouse name: N/A    Number of children: N/A    Years of education: N/A     Social History Main Topics    Smoking status: Current Some Day Smoker     Types: Cigars     Last attempt to quit: 09/09/2015    Smokeless tobacco: Never Used      Comment: FTs (Black and Mild)    Alcohol use Yes      Comment: occaasionally    Drug use: No    Sexual activity: No     Other Topics Concern    Caffeine Use Yes    Occupational Exposure No    Exercise No    Seat Belt Yes     Social History Narrative    None     I have reviewed the past social history.  Additional history obtained: yes.    Family History:    History reviewed. No pertinent family history.  I have reviewed the past family history.  Additional history obtained: yes.    Medications:  Previous Medications    OXYBUTYNIN (DITROPAN) 5 MG TABLET    Take 1 tablet (5 mg total) by mouth 3 times a day as needed (Bladder spasms).       Allergies:   Allergies as of 04/27/2018    (No Known Allergies)       Review of Systems     Review of Systems   Constitutional: Negative for chills and fever.   Eyes: Negative for discharge and visual disturbance.   Respiratory: Negative for chest tightness and shortness of breath.    Cardiovascular: Negative for chest pain.   Gastrointestinal: Negative for abdominal pain, diarrhea, nausea and vomiting.   Musculoskeletal: Negative for arthralgias, back pain, gait problem, joint swelling and myalgias.   Skin: Negative for color change, pallor, rash and wound.   Neurological: Negative for dizziness and weakness.   Hematological: Negative for adenopathy.   Psychiatric/Behavioral: Positive for sleep disturbance. Negative for agitation, behavioral problems, confusion, decreased concentration, depression, dysphoric mood, hallucinations, self-injury and suicidal ideas. The patient is not nervous/anxious and is not hyperactive.          Physical Exam/Objective Data     ED Triage Vitals  [04/27/18 2255]   Vital Signs Group      Temp 98 F (36.7 C)      Temp Source Oral      Heart Rate 79      Heart Rate Source  Monitor      Resp 16      SpO2 100 %      BP 104/77      MAP (mmHg)       BP Location Right arm      BP Method Automatic      Patient Position Sitting   SpO2 100 %   O2 Device None (Room air)       Physical Exam   Nursing note and vitals reviewed.  Physical Exam by Marchelle Gearing APRN  5/ 25/19:   Constitutional symptoms:  Denied complaints  Eyes:  Denied complaints  Ears, nose, mouth, throat:  Denied complaints  Cardiovascular:  Denied complaints  Respiratory:  Denied complaints  Gastrointestinal:  Denied complaints  Genitourinary:  Denied complaints  Musculoskeletal:  Denied complaints  Integumentary:  Denied complaints  Neurological:  Denied complaints  Endocrine: Denied complaints  Hematologic/Lymphatic:  Denied complaints  Allergic/Immunologic:  Denied complaints      Mental Status Exam:     Gait and Muscle Strength:  Normal  Appearance and Behavior: Calm, Cooperative and Open Historian      Groomed and NL Body Habitus  Speech: NL articulation, prosody, volume and production  Language: Naming intact  Mood: appropriate  Affect: appropriate  Thought Process and Associations: goal directed       No loose associations  Thought Content: no suicidal/homicidal with plans for the future and contracting for safety  Abnormal or psychotic thoughts: None  Orientation: person, place, time/date, situation, day of week, month of year and year  Memory: recent, remote, and immediate recall intact  Attention and Concentration: intact  Abstraction: Attention and concentration intact  Fund of Knowledge: average  Insight and Judgement: Full     Fair        Labs:    Please see electronic medical record for any tests performed in the ED.    No results found for this or any previous visit (from the past 24 hour(s)).    Radiology and EKG:  No results found.    EKG: Please see electronic medical record for any  studies performed in the ED.    Emergency Course and Plan     Sophia Pittman is a 32 y.o. female who presented to the emergency department with Suicidal (Admit to OTA)    Patient presents to [facility] via Police on a SOB 2/2 SI. She is polite engaging Denies SI/HI, A/VH. She reports that she is calm and feeling good returning back home to her cat. She also will like to be a work this morning all indications that she is feature oriented.  Sophia Pittman was evaluated and offered supportive counseling. She is interested in outpatient counseling and also agreeable to restart medications.   At this time the patient does not represent an imminent danger to self or others and symptoms can be managed outpatient. The patient will be discharged.  Plan was reviewed with Sophia Pittman is agreeable with plan.     Medications education:    Risk, benefits, side affects, and black box warning with increase risk of suicidally on antidepressants (in this case Prozac) discussed. Prozac start 20mg  to be titrated if not therapeutic after seeing outpatient psychiatrist. Once again pt verbalize understanding.       We talked at length about outpatient therapy 2/2 ongoing grieving of grandmother who died 3 yrs ago.     Patient and father are advise to called 911, Mobile crisis [phone]) or return back  to PES if suicidal ideation worsens or exhibiting serious side effects as mentioned above and discussed in details with patient/father who verbalized understanding.          Diagnosis:    Primary psychiatric Diagnosis: Depression with SI  Other psychiatric Diagnoses: None   Substance Use Diagnoses: Denies   Medical Diagnoses: None acute       Disposition:      Discharged from the ED. See AVS for prescriptions, followup, and discharge instructions.  No emergency medical condition present at discharge.  Patient not deemed to be an imminent threat of harm to self or others.  Patient has a good safety plan and discharge disposition in place.  Protective factors: Support System, Connected to [employer], Customer service manager, Self Esteem and Sense of Purpose.   Summary of rationale for disposition: See above .    Provider completing note: Clinical Nurse Specialist, supervised by Dr. Malen Gauze.    Patient was in OTA.     Patient had a completed Statement of Belief during this encounter:yes  , released by attending physician.    Medications given in PES: no.  Medications prescribed for home or inpatient use: yes.  Laboratory work ordered: no.  Other diagnostic studies ordered: no.  Old and/or outside medical records reviewed: yes.  Collateral information contacted: yes.  Patient's outside provider contacted: no.         Braheem Tomasik Lawford Addysin Porco  04/28/18 0238

## 2018-05-01 ENCOUNTER — Ambulatory Visit
Admit: 2018-05-01 | Discharge: 2018-05-01 | Payer: PRIVATE HEALTH INSURANCE | Attending: Obstetrics & Gynecology | Primary: Internal Medicine

## 2018-05-01 DIAGNOSIS — Z9889 Other specified postprocedural states: Secondary | ICD-10-CM

## 2018-05-02 NOTE — Progress Notes (Signed)
[facility] PHYSICIAN PRACTICES  [facility] GYNECOLOGY  [address]  [place] [address]  Dept: [phone]  Dept Fax: [phone]  Loc: [phone]     05/01/2018    Heather Sampson (DOB:  1986/01/25) is a 32 y.o. female, here for evaluation of the following medical concerns:    Patient with hx HGSIL. Due for repeat pap. Hx myomectomy and states bleeding is a little better.     Gynecologic Exam   This is a recurrent problem. The current episode started more than 1 month ago. The problem occurs intermittently. The problem has been gradually improving. Associated symptoms include abdominal pain. Pertinent negatives include no urinary symptoms. Nothing aggravates the symptoms. She has tried NSAIDs for the symptoms. The treatment provided moderate relief.       Review of Systems   Gastrointestinal: Positive for abdominal pain.   Genitourinary: Positive for menstrual problem and pelvic pain.       Prior to Visit Medications    Medication Sig Taking? Authorizing Provider   ferrous sulfate 325 (65 Fe) MG tablet Take 1 tablet by mouth daily (with breakfast) Yes Estill Batten, MD   PARoxetine (PAXIL) 10 MG tablet TAKE 1 TABLET BY MOUTH EVERY MORNING  Baruch Goldmann, DO   nystatin (MYCOSTATIN) 100000 UNIT/GM powder Apply topically 2 times daily  Estill Batten, MD   senna-docusate (PERICOLACE) 8.6-50 MG per tablet Take 2 tablets by mouth daily  Baruch Goldmann, DO   Probiotic Product (PROBIOTIC-10) CAPS Take by mouth daily  Historical Provider, MD   busPIRone (BUSPAR) 7.5 MG tablet Take 1 tablet by mouth 3 times daily  Patient taking differently: Take 7.5 mg by mouth 3 times daily as needed   Baruch Goldmann, DO        No Known Allergies    Past Medical History:   Diagnosis Date   ??? Abnormal Pap smear of cervix 12/19/2017    ascus and hpv+   ??? Anemia    ??? Anxiety and depression    ??? Contact lens/glasses fitting    ??? Dysmenorrhea    ??? Fibroid    ??? History of kidney stones 2016   ??? 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 Ovarian cyst        Past Surgical History:   Procedure Laterality Date   ??? COLPOSCOPY  2019   ??? EXTERNAL EAR SURGERY      "pit holes"--at age 60   ??? LEEP  2019   ??? MYOMECTOMY N/A 01/19/2018    ROBOTIC ASSISTED MYOMECTOMY, BILATERAL PERATUBAL CYST REMOVAL, COLD KNIFE CONE BIOPSY performed by Estill Batten, MD at [facility] OR   ??? OTHER SURGICAL HISTORY  01/19/2018    ROBOTIC ASSISTED MYOMECTOMY, bilateral paratubal cyst removal, COLD KNIFE CONE BIOPSY   ??? URETER STENT PLACEMENT Right 2016       Social History     Socioeconomic History   ??? Marital status: Single     Spouse name: Not on file   ??? Number of children: Not on file   ??? Years of education: Not on file   ??? Highest education level: Not on file   Occupational History   ??? Not on file   Social Needs   ??? Financial resource strain: Not on file   ??? Food insecurity:     Worry: Not on file     Inability: Not on file   ??? Transportation needs:  Medical: Not on file     Non-medical: Not on file   Tobacco Use   ??? Smoking status: Former Smoker     Packs/day: 0.25     Years: 1.00     Pack years: 0.25     Types: Cigars     Start date: 12/05/2014     Last attempt to quit: 12/04/2017     Years since quitting: 0.4   ??? Smokeless tobacco: Never Used   ??? Tobacco comment: 1 black and mild/day, smoking cessation 11/18   Substance and Sexual Activity   ??? Alcohol use: Yes     Comment: 1 drink/month   ??? Drug use: No   ??? Sexual activity: Not Currently   Lifestyle   ??? Physical activity:     Days per week: Not on file     Minutes per session: Not on file   ??? Stress: Not on file   Relationships   ??? Social connections:     Talks on phone: Not on file     Gets together: Not on file     Attends religious service: Not on file     Active member of club or organization: Not on file     Attends meetings of clubs or organizations: Not on file     Relationship status: Not on file   ??? Intimate partner violence:     Fear of current or ex partner: Not on file      Emotionally abused: Not on file     Physically abused: Not on file     Forced sexual activity: Not on file   Other Topics Concern   ??? Not on file   Social History Narrative   ??? Not on file        Family History   Problem Relation Age of Onset   ??? No Known Problems Mother    ??? No Known Problems Father        Vitals:    05/01/18 1012   BP: 118/70   Site: Right Upper Arm   Position: Sitting   Cuff Size: Medium Adult   Pulse: 68   Resp: 17   Weight: 181 lb (82.1 kg)   Height:  (1.651 m)     Estimated body mass index is 30.12 kg/m?? as calculated from the following:    Height as of this encounter:  (1.651 m).    Weight as of this encounter: 181 lb (82.1 kg).    Physical Exam   Constitutional: She is oriented to person, place, and time. She appears well-developed and well-nourished. No distress.   HENT:   Head: Normocephalic.   Eyes: Pupils are equal, round, and reactive to light.   Abdominal: Soft. She exhibits no distension and no mass. There is no tenderness. There is no rebound and no guarding.   Genitourinary: Uterus normal. Rectal exam shows no external hemorrhoid. No labial fusion. There is no rash, tenderness, lesion or injury on the right labia. There is no rash, tenderness, lesion or injury on the left labia. Uterus is not deviated, not enlarged, not fixed and not tender. Cervix exhibits no motion tenderness, no discharge and no friability. Right adnexum displays no mass, no tenderness and no fullness. Left adnexum displays no mass, no tenderness and no fullness. No erythema, tenderness or bleeding in the vagina. No foreign body in the vagina. No signs of injury around the vagina. No vaginal discharge found.   Genitourinary Comments: Normal urethral meatus,  nl urethra, nl bladder.   Musculoskeletal: Normal range of motion. She exhibits no edema or tenderness.   Neurological: She is alert and oriented to person, place, and time.   Skin: Skin is warm and dry. No rash noted. She is not diaphoretic. No  erythema. No pallor.   Psychiatric: She has a normal mood and affect. Her behavior is normal. Judgment and thought content normal.       ASSESSMENT/PLAN:  1. Hx hgsil-for repeat pap smear  2. Fibroids-had removal. Bleeding has improved.   Continue to follow symptoms.     I have seen and examined patient and spent 15 minutes with patient.

## 2018-05-04 LAB — HUMAN PAPILLOMAVIRUS (HPV) DNA PROBE THIN PREP HIGH RISK
HPV Genotype 16: NOT DETECTED
HPV Type 18: NOT DETECTED
HPVOH (OTHER TYPES): NOT DETECTED

## 2018-08-21 ENCOUNTER — Encounter: Attending: Obstetrics & Gynecology | Primary: Internal Medicine

## 2018-09-10 ENCOUNTER — Ambulatory Visit
Admit: 2018-09-10 | Discharge: 2018-09-10 | Payer: PRIVATE HEALTH INSURANCE | Attending: Obstetrics & Gynecology | Primary: Internal Medicine

## 2018-09-10 DIAGNOSIS — Z9889 Other specified postprocedural states: Secondary | ICD-10-CM

## 2018-09-10 MED ORDER — NORETHINDRONE 0.35 MG PO TABS
0.35 MG | ORAL_TABLET | Freq: Every day | ORAL | 3 refills | Status: DC
Start: 2018-09-10 — End: 2019-09-19

## 2018-09-10 MED ORDER — IBUPROFEN 600 MG PO TABS
600 MG | ORAL_TABLET | Freq: Four times a day (QID) | ORAL | 2 refills | Status: DC | PRN
Start: 2018-09-10 — End: 2019-11-22

## 2018-09-12 LAB — HUMAN PAPILLOMAVIRUS (HPV) DNA PROBE THIN PREP HIGH RISK
HPV Genotype 16: DETECTED — AB
HPV Type 18: NOT DETECTED
HPVOH (OTHER TYPES): NOT DETECTED

## 2018-09-12 NOTE — Progress Notes (Signed)
[facility]  [facility]  [address]  [address]  [place] [address]  Dept: [phone]  Dept Fax: [phone]  Loc: [phone]     09/10/2018    Heather Sampson (DOB:  [birth year]) is a 32 y.o. female, here for evaluation of the following medical concerns:    Patient is here for repeat pap smear. Patient with pelvic cramping/pain.       Review of Systems   Constitutional: Negative.    HENT: Negative.    Eyes: Negative.    Respiratory: Negative.    Cardiovascular: Negative.    Gastrointestinal: Negative.    Genitourinary: Negative.    Musculoskeletal: Negative.    Skin: Negative.    Neurological: Negative.    Psychiatric/Behavioral: Negative.        Prior to Visit Medications    Medication Sig Taking? Authorizing Provider   norethindrone (ORTHO MICRONOR) 0.35 MG tablet Take 1 tablet by mouth daily Yes Estill Batten, MD   ibuprofen (ADVIL;MOTRIN) 600 MG tablet Take 1 tablet by mouth every 6 hours as needed for Pain Yes Estill Batten, MD   PARoxetine (PAXIL) 10 MG tablet TAKE 1 TABLET BY MOUTH EVERY MORNING  Patient not taking: Reported on 09/10/2018  Baruch Goldmann, DO   nystatin (MYCOSTATIN) 100000 UNIT/GM powder Apply topically 2 times daily  Patient not taking: Reported on 09/10/2018  Estill Batten, MD   senna-docusate (PERICOLACE) 8.6-50 MG per tablet Take 2 tablets by mouth daily  Patient not taking: Reported on 09/10/2018  Baruch Goldmann, DO   Probiotic Product (PROBIOTIC-10) CAPS Take by mouth daily  Historical Provider, MD   ferrous sulfate 325 (65 Fe) MG tablet Take 1 tablet by mouth daily (with breakfast)  Patient not taking: Reported on 09/10/2018  Estill Batten, MD   busPIRone (BUSPAR) 7.5 MG tablet Take 1 tablet by mouth 3 times daily  Patient not taking: Reported on 09/10/2018  Baruch Goldmann, DO        No Known Allergies    Past Medical History:   Diagnosis Date   . Abnormal Pap smear of cervix 12/19/2017    ascus and hpv+   . Anemia    . Anxiety and depression    .  Contact lens/glasses fitting    . Dysmenorrhea    . Fibroid    . History of kidney stones 2016   . 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 Ovarian cyst        Past Surgical History:   Procedure Laterality Date   . COLPOSCOPY  2019   . EXTERNAL EAR SURGERY      "pit holes"--at age 62   . LEEP  2019   . MYOMECTOMY N/A 01/19/2018    ROBOTIC ASSISTED MYOMECTOMY, BILATERAL PERATUBAL CYST REMOVAL, COLD KNIFE CONE BIOPSY performed by Estill Batten, MD at [facility] OR   . OTHER SURGICAL HISTORY  01/19/2018    ROBOTIC ASSISTED MYOMECTOMY, bilateral paratubal cyst removal, COLD KNIFE CONE BIOPSY   . URETER STENT PLACEMENT Right 2016       Social History     Socioeconomic History   . Marital status: Single     Spouse name: Not on file   . Number of children: Not on file   . Years of education: Not on file   . Highest education level: Not on file   Occupational History   . Not on  file   Social Needs   . Financial resource strain: Not on file   . Food insecurity:     Worry: Not on file     Inability: Not on file   . Transportation needs:     Medical: Not on file     Non-medical: Not on file   Tobacco Use   . Smoking status: Current Some Day Smoker     Packs/day: 0.25     Years: 1.00     Pack years: 0.25     Types: Cigars     Start date: 04/28/2018   . Smokeless tobacco: Current User   . Tobacco comment: 1 black and mild/day, smoking cessation 11/18   Substance and Sexual Activity   . Alcohol use: Yes     Comment: 1 drink/month   . Drug use: No   . Sexual activity: Not Currently   Lifestyle   . Physical activity:     Days per week: Not on file     Minutes per session: Not on file   . Stress: Not on file   Relationships   . Social connections:     Talks on phone: Not on file     Gets together: Not on file     Attends religious service: Not on file     Active member of club or organization: Not on file     Attends meetings of clubs or organizations: Not on file     Relationship status: Not on file   .  Intimate partner violence:     Fear of current or ex partner: Not on file     Emotionally abused: Not on file     Physically abused: Not on file     Forced sexual activity: Not on file   Other Topics Concern   . Not on file   Social History Narrative   . Not on file        Family History   Problem Relation Age of Onset   . No Known Problems Mother    . No Known Problems Father        Vitals:    09/10/18 0906   BP: 125/77   Pulse: 65   Resp: 17   Weight: 181 lb 12.8 oz (82.5 kg)   Height: 5\' 6"  (1.676 m)     Estimated body mass index is 29.34 kg/m as calculated from the following:    Height as of this encounter: 5\' 6"  (1.676 m).    Weight as of this encounter: 181 lb 12.8 oz (82.5 kg).    Physical Exam   Constitutional: She is oriented to person, place, and time. She appears well-developed and well-nourished. No distress.   HENT:   Head: Normocephalic.   Eyes: Pupils are equal, round, and reactive to light.   Abdominal: Soft. She exhibits no distension and no mass. There is no tenderness. There is no rebound and no guarding.   Genitourinary: Uterus normal. Rectal exam shows no external hemorrhoid. No labial fusion. There is no rash, tenderness, lesion or injury on the right labia. There is no rash, tenderness, lesion or injury on the left labia. Uterus is not deviated, not enlarged, not fixed and not tender. Cervix exhibits no motion tenderness, no discharge and no friability. Right adnexum displays no mass, no tenderness and no fullness. Left adnexum displays no mass, no tenderness and no fullness. No erythema, tenderness or bleeding in the vagina. No foreign body in the vagina. No  signs of injury around the vagina. No vaginal discharge found.   Genitourinary Comments: Normal urethral meatus, nl urethra, nl bladder.   Musculoskeletal: Normal range of motion. She exhibits no edema or tenderness.   Neurological: She is alert and oriented to person, place, and time.   Skin: Skin is warm and dry. No rash noted. She is  not diaphoretic. No erythema. No pallor.   Psychiatric: She has a normal mood and affect. Her behavior is normal. Judgment and thought content normal.       ASSESSMENT/PLAN:  1. S/p leep-for repeat pap  2. Pelvic pain female-pelvic ultrasound  -micronor see if helps  -Ibuprofen    I have seen and examined patient and spent 15 minutes face to face with patient.

## 2018-10-01 ENCOUNTER — Ambulatory Visit: Payer: PRIVATE HEALTH INSURANCE | Primary: Internal Medicine

## 2018-10-19 ENCOUNTER — Encounter

## 2018-10-19 MED ORDER — NITROFURANTOIN MONOHYD MACRO 100 MG PO CAPS
100 MG | ORAL_CAPSULE | Freq: Two times a day (BID) | ORAL | 0 refills | Status: AC
Start: 2018-10-19 — End: 2018-10-26

## 2018-10-19 NOTE — Telephone Encounter (Signed)
From: Kurtis Bushmanegina Plasencia  To: Estill Battenaroline J Bohme, MD  Sent: 10/18/2018 7:06 PM EST  Subject: Prescription Question    Hi! Dr.Bohme. I am experiencing a UTI. Is there any suggestions on what I should take or if I could get prescribed an antibiotic to clear it up.

## 2019-01-21 ENCOUNTER — Ambulatory Visit
Admit: 2019-01-21 | Discharge: 2019-01-21 | Payer: PRIVATE HEALTH INSURANCE | Attending: Obstetrics & Gynecology | Primary: Internal Medicine

## 2019-01-21 DIAGNOSIS — B977 Papillomavirus as the cause of diseases classified elsewhere: Secondary | ICD-10-CM

## 2019-01-23 LAB — HUMAN PAPILLOMAVIRUS (HPV) DNA PROBE THIN PREP HIGH RISK
HPV Genotype 16: NOT DETECTED
HPV Type 18: NOT DETECTED
HPVOH (Other Types): NOT DETECTED

## 2019-01-26 NOTE — Progress Notes (Signed)
Subjective:      Patient ID: Heather Sampson is a 33 y.o. female.    Patient is here for annual.       Review of Systems   Constitutional: Negative.    HENT: Negative.    Eyes: Negative.    Respiratory: Negative.    Cardiovascular: Negative.    Gastrointestinal: Negative.    Genitourinary: Negative.    Musculoskeletal: Negative.    Skin: Negative.    Neurological: Negative.    Psychiatric/Behavioral: Negative.      Date of Birth Nov 20, 1986  Past Medical History:   Diagnosis Date   ??? Abnormal Pap smear of cervix 12/19/2017    ascus and hpv+   ??? Anemia    ??? Anxiety and depression    ??? Contact lens/glasses fitting    ??? Dysmenorrhea    ??? Fibroid    ??? History of kidney stones 2016   ??? 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 Ovarian cyst      Past Surgical History:   Procedure Laterality Date   ??? COLPOSCOPY  2019   ??? EXTERNAL EAR SURGERY      "pit holes"--at age 76   ??? LEEP  2019   ??? MYOMECTOMY N/A 01/19/2018    ROBOTIC ASSISTED MYOMECTOMY, BILATERAL PERATUBAL CYST REMOVAL, COLD KNIFE CONE BIOPSY performed by Estill Batten, MD at [facility] OR   ??? OTHER SURGICAL HISTORY  01/19/2018    ROBOTIC ASSISTED MYOMECTOMY, bilateral paratubal cyst removal, COLD KNIFE CONE BIOPSY   ??? URETER STENT PLACEMENT Right 2016     OB History   Gravida Para Term Preterm AB Living   1       1     SAB TAB Ectopic Molar Multiple Live Births   1                # Outcome Date GA Lbr Len/2nd Weight Sex Delivery Anes PTL Lv   1 SAB 2018             Social History     Socioeconomic History   ??? Marital status: Single     Spouse name: Not on file   ??? Number of children: Not on file   ??? Years of education: Not on file   ??? Highest education level: Not on file   Occupational History   ??? Not on file   Social Needs   ??? Financial resource strain: Not on file   ??? Food insecurity:     Worry: Not on file     Inability: Not on file   ??? Transportation needs:     Medical: Not on file     Non-medical: Not on file   Tobacco Use   ??? Smoking  status: Current Some Day Smoker     Packs/day: 0.25     Years: 1.00     Pack years: 0.25     Types: Cigars     Start date: 04/28/2018   ??? Smokeless tobacco: Current User   ??? Tobacco comment: 1 black and mild/day, smoking cessation 11/18, cessation 10/19   Substance and Sexual Activity   ??? Alcohol use: Yes     Comment: 1 drink/month   ??? Drug use: No   ??? Sexual activity: Not Currently   Lifestyle   ??? Physical activity:     Days per week: Not on file     Minutes per session: Not on file   ???  Stress: Not on file   Relationships   ??? Social connections:     Talks on phone: Not on file     Gets together: Not on file     Attends religious service: Not on file     Active member of club or organization: Not on file     Attends meetings of clubs or organizations: Not on file     Relationship status: Not on file   ??? Intimate partner violence:     Fear of current or ex partner: Not on file     Emotionally abused: Not on file     Physically abused: Not on file     Forced sexual activity: Not on file   Other Topics Concern   ??? Not on file   Social History Narrative   ??? Not on file     No Known Allergies  Outpatient Medications Marked as Taking for the 01/21/19 encounter (Office Visit) with Estill Batten, MD   Medication Sig Dispense Refill   ??? norethindrone (ORTHO MICRONOR) 0.35 MG tablet Take 1 tablet by mouth daily 90 tablet 3   ??? ibuprofen (ADVIL;MOTRIN) 600 MG tablet Take 1 tablet by mouth every 6 hours as needed for Pain 60 tablet 2     Family History   Problem Relation Age of Onset   ??? No Known Problems Mother    ??? No Known Problems Father      BP 118/77 (Site: Right Upper Arm, Position: Sitting, Cuff Size: Medium Adult)    Pulse 65    Resp 17    Ht 5\' 5"  (1.651 m)    Wt 184 lb 9.6 oz (83.7 kg)    LMP 01/12/2019    BMI 30.72 kg/m??       Objective:   Physical Exam  Constitutional:       Appearance: Normal appearance. She is well-developed and normal weight.   HENT:      Head: Normocephalic.      Nose: Nose normal.       Mouth/Throat:      Mouth: Mucous membranes are moist.      Pharynx: Oropharynx is clear.   Eyes:      Pupils: Pupils are equal, round, and reactive to light.   Neck:      Musculoskeletal: Normal range of motion and neck supple. No neck rigidity.      Thyroid: No thyromegaly.   Cardiovascular:      Rate and Rhythm: Normal rate and regular rhythm.      Pulses: Normal pulses.      Heart sounds: Normal heart sounds. No murmur. No friction rub. No gallop.    Pulmonary:      Effort: Pulmonary effort is normal. No respiratory distress.      Breath sounds: Normal breath sounds. No stridor. No wheezing, rhonchi or rales.   Chest:      Chest wall: No tenderness.      Breasts:         Right: Normal. No swelling, bleeding, inverted nipple, mass, nipple discharge, skin change or tenderness.         Left: Normal. No swelling, bleeding, inverted nipple, mass, nipple discharge, skin change or tenderness.   Abdominal:      General: Bowel sounds are normal. There is no distension.      Palpations: Abdomen is soft. There is no mass.      Tenderness: There is no abdominal tenderness. There is no guarding or rebound.  Hernia: No hernia is present. There is no hernia in the right inguinal area or left inguinal area.   Genitourinary:     General: Normal vulva.      Exam position: Lithotomy position.      Pubic Area: No rash.       Labia:         Right: No rash, tenderness, lesion or injury.         Left: No rash, tenderness, lesion or injury.       Urethra: No prolapse, urethral pain, urethral swelling or urethral lesion.      Vagina: No signs of injury and foreign body. No vaginal discharge, erythema, tenderness, bleeding, lesions or prolapsed vaginal walls.      Cervix: No cervical motion tenderness, discharge, friability, lesion, erythema, cervical bleeding or eversion.      Uterus: Not deviated, not enlarged, not fixed and not tender.       Adnexa:         Right: No mass, tenderness or fullness.          Left: No mass, tenderness  or fullness.        Rectum: No anal fissure or external hemorrhoid.      Comments: Normal urethral meatus, normal urethra, nl bladder  Musculoskeletal: Normal range of motion.         General: No tenderness.   Lymphadenopathy:      Cervical: No cervical adenopathy.      Lower Body: No right inguinal adenopathy. No left inguinal adenopathy.   Skin:     General: Skin is warm and dry.      Coloration: Skin is not pale.      Findings: No erythema or rash.   Neurological:      General: No focal deficit present.      Mental Status: She is alert and oriented to person, place, and time. Mental status is at baseline.      Deep Tendon Reflexes: Reflexes are normal and symmetric.   Psychiatric:         Mood and Affect: Mood normal.         Behavior: Behavior normal.         Thought Content: Thought content normal.         Judgment: Judgment normal.         Assessment:      1. Annual        Plan:      1. Pap, calcium, exercise        Estill Batten, MD

## 2019-09-19 ENCOUNTER — Telehealth
Admit: 2019-09-19 | Discharge: 2019-09-19 | Payer: PRIVATE HEALTH INSURANCE | Attending: Internal Medicine | Primary: Internal Medicine

## 2019-09-19 DIAGNOSIS — R5383 Other fatigue: Secondary | ICD-10-CM

## 2019-09-19 NOTE — Progress Notes (Signed)
09/19/2019    TELEHEALTH EVALUATION -- Audio/Visual (During VWUJW-11 public health emergency)    HPI:    Heather Sampson (DOB:  1986/05/25) has requested an audio/video evaluation for the following concern(s):    New patient, get established.  Chart reviewed and medications updated.    Overall she is experiencing some fatigue and dysthymia which she states happens this time of year because it is the anniversary time of her grandmothers death.    She does complain of some redness and irritation around the labia.    She tells me she will be needing a preop likely in the month of December for a plastic surgery procedure called a BBL (Journalist, newspaper) that she plans to get in [place].    Review of Systems   Constitutional: Positive for fatigue. Negative for fever.   HENT: Negative for rhinorrhea, sore throat and trouble swallowing.    Eyes: Negative for visual disturbance.   Respiratory: Negative for cough and shortness of breath.    Cardiovascular: Negative for chest pain and palpitations.   Gastrointestinal: Negative for abdominal pain, diarrhea and nausea.   Genitourinary: Positive for vaginal pain. Negative for decreased urine volume, dysuria and frequency.   Musculoskeletal: Negative for arthralgias and myalgias.   Skin: Negative for rash.   Allergic/Immunologic: Negative for immunocompromised state.   Neurological: Negative for dizziness, numbness and headaches.   Hematological: Does not bruise/bleed easily.   Psychiatric/Behavioral: Positive for dysphoric mood. Negative for sleep disturbance. The patient is not nervous/anxious.           Prior to Visit Medications    Medication Sig Taking? Authorizing Provider   ibuprofen (ADVIL;MOTRIN) 600 MG tablet Take 1 tablet by mouth every 6 hours as needed for Pain Yes Pablo Lawrence, MD       Social History     Tobacco Use   ??? Smoking status: Current Some Day Smoker     Packs/day: 0.25     Years: 1.00     Pack years: 0.25     Types: Cigars     Start date: 04/28/2018    ??? Smokeless tobacco: Current User   ??? Tobacco comment: 1 black and mild/day, smoking cessation 11/18, cessation 10/19   Substance Use Topics   ??? Alcohol use: Yes     Frequency: 2-4 times a month     Drinks per session: 1 or 2     Comment: 1 drink/month   ??? Drug use: Yes     Types: Marijuana        Past Medical History:   Diagnosis Date   ??? Abnormal Pap smear of cervix 12/19/2017    ascus and hpv+   ??? Anemia    ??? Anxiety and depression    ??? Contact lens/glasses fitting    ??? History of chicken pox     age 53   ??? History of kidney stones 2016       PHYSICAL EXAMINATION:    Constitutional: [x]  Appears well-developed and well-nourished [x]  No apparent distress        Mental status  [x]  Alert and awake  [x]  Oriented to person/place/time [x] Able to follow commands      Eyes:  EOM    [x]   Normal   Sclera  [x]   Normal     HENT:   [x]  Normocephalic, atraumatic.    Mouth/Throat: Mucous membranes are moist.     External Ears [x]  Normal      Neck: [x]  No visualized mass  Pulmonary/Chest: [x]  Respiratory effort normal.  [x]  No visualized signs of difficulty breathing or respiratory distress           Musculoskeletal:   [x]  Normal gait with no signs of ataxia         [x]  Normal range of motion of neck          Neurological:        [x]  No Facial Asymmetry (Cranial nerve 7 motor function) (limited exam to video visit)      Skin:        [x]  No significant exanthematous lesions or discoloration noted on facial skin                  Psychiatric:       [x]  Normal Affect         Other pertinent observable physical exam findings-     ASSESSMENT/PLAN:  1. Fatigue, unspecified type    - CBC; Future  - Iron and TIBC; Future  - Ferritin; Future  - Comprehensive Metabolic Panel; Future  - TSH without Reflex; Future  - Vitamin D 25 Hydroxy; Future    2. Subacute vaginitis  Advised a trial of topical clotrimazole and topical hydrocortisone 1% over-the-counter    3. Screening for HIV (human immunodeficiency virus)    - HIV-1 AND HIV-2  ANTIBODIES; Future    4. Screening, lipid  - Lipid Panel; Future    5. HM-flu shot encouraged    Return in about 2 months (around 11/19/2019).    Heather Sampson is a 33 y.o. female being evaluated by a Virtual Visit (video visit) encounter to address concerns as mentioned above.  A caregiver was present when appropriate. Due to this being a (During COVID-19 public health emergency), evaluation of the following organ systems was limited: Vitals/Constitutional/EENT/Resp/CV/GI/GU/MS/Neuro/Skin/Heme-Lymph-Imm.  Pursuant to the emergency declaration under the [facility] Act and the , 1135 waiver authority and the and 11/21/2019 Act, this Virtual Visit was conducted with patient's (and/or legal guardian's) consent, to reduce the patient's risk of exposure to COVID-19 and provide necessary medical care.  The patient (and/or legal guardian) has also been advised to contact this office for worsening conditions or problems, and seek emergency medical treatment and/or call 911 if deemed necessary.     Patient identification was verified at the start of the visit: {YES    Total time spent on this encounter: {Time Spent:30 min    Services were provided through a video synchronous discussion virtually to substitute for in-person clinic visit. Patient and provider were located at their individual homes.    --Pritika Alvarez [name], MD on 09/19/2019 at 6:20 PM    An electronic signature was used to authenticate this note.

## 2019-09-19 NOTE — Patient Instructions (Addendum)
[address] or [facility] outpatient lab or [employer] labs ordered    Flu shot     Find shot records and send to [place] (tdap)     Clotrimazole OTC twice daily  Hydrocortisone 1% twice daily OTC  See Dr. Yvette Rack if not better

## 2019-09-20 ENCOUNTER — Encounter

## 2019-09-20 LAB — COMPREHENSIVE METABOLIC PANEL
ALT: 8 U/L — ABNORMAL LOW (ref 10–40)
AST: 15 U/L (ref 15–37)
Albumin/Globulin Ratio: 1.6 (ref 1.1–2.2)
Albumin: 4.6 g/dL (ref 3.4–5.0)
Alkaline Phosphatase: 88 U/L (ref 40–129)
Anion Gap: 12 (ref 3–16)
BUN: 10 mg/dL (ref 7–20)
CO2: 24 mmol/L (ref 21–32)
Calcium: 9.2 mg/dL (ref 8.3–10.6)
Chloride: 102 mmol/L (ref 99–110)
Creatinine: <0.5 mg/dL — ABNORMAL LOW (ref 0.6–1.1)
GFR African American: >60 (ref >60)
GFR Non-African American: >60 (ref >60)
Globulin: 2.9 g/dL
Glucose: 84 mg/dL (ref 70–99)
Potassium: 4.5 mmol/L (ref 3.5–5.1)
Sodium: 138 mmol/L (ref 136–145)
Total Bilirubin: <0.2 mg/dL (ref 0.0–1.0)
Total Protein: 7.5 g/dL (ref 6.4–8.2)

## 2019-09-20 LAB — HIV SCREEN
HIV ANTIGEN: NONREACTIVE
HIV Ag/Ab: NONREACTIVE
HIV-1 Antibody: NONREACTIVE
HIV-2 Ab: NONREACTIVE

## 2019-09-20 LAB — CBC
Hematocrit: 34.7 % — ABNORMAL LOW (ref 36.0–48.0)
Hemoglobin: 11.2 g/dL — ABNORMAL LOW (ref 12.0–16.0)
MCH: 27.4 pg (ref 26.0–34.0)
MCHC: 32.3 g/dL (ref 31.0–36.0)
MCV: 85 fL (ref 80.0–100.0)
MPV: 9.1 fL (ref 5.0–10.5)
Platelets: 325 10*3/uL (ref 135–450)
RBC: 4.08 M/uL (ref 4.00–5.20)
RDW: 16.2 % — ABNORMAL HIGH (ref 12.4–15.4)
WBC: 7.3 10*3/uL (ref 4.0–11.0)

## 2019-09-20 LAB — TSH: TSH: 1.76 u[IU]/mL (ref 0.27–4.20)

## 2019-09-20 LAB — VITAMIN D 25 HYDROXY: Vit D, 25-Hydroxy: 24.3 ng/mL — ABNORMAL LOW (ref >=30)

## 2019-09-20 LAB — LIPID PANEL
Cholesterol, Total: 154 mg/dL (ref 0–199)
HDL: 56 mg/dL (ref 40–60)
LDL Calculated: 87 mg/dL (ref <100)
Triglycerides: 53 mg/dL (ref 0–150)
VLDL Cholesterol Calculated: 11 mg/dL

## 2019-09-20 LAB — IRON AND TIBC
Iron Saturation: 9 % — ABNORMAL LOW (ref 15–50)
Iron: 40 ug/dL (ref 37–145)
TIBC: 426 ug/dL (ref 260–445)

## 2019-09-20 LAB — FERRITIN: Ferritin: 8.3 ng/mL — ABNORMAL LOW (ref 15.0–150.0)

## 2019-09-23 ENCOUNTER — Encounter

## 2019-09-23 MED ORDER — VITAMIN D3 25 MCG (1000 UT) PO TABS
25 MCG (1000 UT) | ORAL_TABLET | Freq: Every day | ORAL | 1 refills | Status: AC
Start: 2019-09-23 — End: ?

## 2019-09-23 MED ORDER — FERROUS SULFATE 325 (65 FE) MG PO TABS
325 (65 Fe) MG | ORAL_TABLET | Freq: Two times a day (BID) | ORAL | 5 refills | Status: DC
Start: 2019-09-23 — End: 2019-09-27

## 2019-09-24 NOTE — Telephone Encounter (Signed)
From: [name]  To: Lorrin Goodell, MD  Sent: 09/24/2019 9:41 AM EDT  Subject: Test Results Question    Hey Dr.Youkilis. I got in contact with Dr.Wenkez office and they set me up with an appt on October 30th, 2020 for a phone visit. Is that correct because they said they are not doing face-to-face visit. So how would I do this gastroenterology evaluation if you don't mind me asking.

## 2019-09-25 NOTE — Telephone Encounter (Signed)
From: [name]  To: Lorrin Goodell, MD  Sent: 09/25/2019 2:36 PM EDT  Subject: Prescription Question    Hi! Dr.Youkilis. As far as that medicine ferrous sulfate it is not sitting well with my stomach after the first night taking it; is there another type of iron pill I can take?

## 2019-09-27 MED ORDER — FERROUS SULFATE 220 (44 FE) MG/5ML PO ELIX
220 (44 Fe) MG/5ML | Freq: Every day | ORAL | 1 refills | Status: DC
Start: 2019-09-27 — End: 2019-09-27

## 2019-09-27 MED ORDER — FERROUS SULFATE 220 (44 FE) MG/5ML PO ELIX
220 (44 Fe) MG/5ML | Freq: Every day | ORAL | 1 refills | Status: DC
Start: 2019-09-27 — End: 2019-11-28

## 2019-09-27 NOTE — Telephone Encounter (Signed)
Resent.

## 2019-09-27 NOTE — Telephone Encounter (Signed)
Pharmacy states they received a prescription for a bottle of:    ferrous sulfate 220 (44 Fe) MG/5ML solution  Take 5 mLs by mouth daily      They state they need to know the size of the bottle, or you can just specify a 30,60, or 90 day supply.  He states 473 ml is over a 90 day supply.    Please contact pharmacy at phone # provided.

## 2019-11-22 ENCOUNTER — Encounter

## 2019-11-22 ENCOUNTER — Ambulatory Visit
Admit: 2019-11-22 | Discharge: 2019-11-22 | Payer: PRIVATE HEALTH INSURANCE | Attending: Internal Medicine | Primary: Internal Medicine

## 2019-11-22 DIAGNOSIS — Z01818 Encounter for other preprocedural examination: Secondary | ICD-10-CM

## 2019-11-22 LAB — APTT: aPTT: 30.6 s (ref 24.2–36.2)

## 2019-11-22 LAB — PROTIME-INR
INR: 1.02 (ref 0.86–1.14)
Protime: 11.8 s (ref 10.0–13.2)

## 2019-11-23 LAB — COMPREHENSIVE METABOLIC PANEL, FASTING
ALT: 9 U/L — ABNORMAL LOW (ref 10–40)
AST: 16 U/L (ref 15–37)
Albumin/Globulin Ratio: 1.6 (ref 1.1–2.2)
Albumin: 4.6 g/dL (ref 3.4–5.0)
Alkaline Phosphatase: 79 U/L (ref 40–129)
Anion Gap: 9 (ref 3–16)
BUN: 15 mg/dL (ref 7–20)
CO2: 27 mmol/L (ref 21–32)
Calcium: 9.8 mg/dL (ref 8.3–10.6)
Chloride: 99 mmol/L (ref 99–110)
Creatinine: <0.5 mg/dL — ABNORMAL LOW (ref 0.6–1.1)
GFR African American: >60 (ref >60)
GFR Non-African American: >60 (ref >60)
Globulin: 2.9 g/dL
Glucose, Fasting: 88 mg/dL (ref 70–99)
Potassium: 4.3 mmol/L (ref 3.5–5.1)
Sodium: 135 mmol/L — ABNORMAL LOW (ref 136–145)
Total Bilirubin: 0.3 mg/dL (ref 0.0–1.0)
Total Protein: 7.5 g/dL (ref 6.4–8.2)

## 2019-11-23 LAB — CBC
Hematocrit: 39.9 % (ref 36.0–48.0)
Hemoglobin: 13.2 g/dL (ref 12.0–16.0)
MCH: 30.3 pg (ref 26.0–34.0)
MCHC: 33.2 g/dL (ref 31.0–36.0)
MCV: 91.1 fL (ref 80.0–100.0)
MPV: 8.5 fL (ref 5.0–10.5)
Platelets: 347 10*3/uL (ref 135–450)
RBC: 4.38 M/uL (ref 4.00–5.20)
RDW: 19.2 % — ABNORMAL HIGH (ref 12.4–15.4)
WBC: 7.6 10*3/uL (ref 4.0–11.0)

## 2019-11-23 LAB — HIV SCREEN
HIV ANTIGEN: NONREACTIVE
HIV Ag/Ab: NONREACTIVE
HIV-1 Antibody: NONREACTIVE
HIV-2 Ab: NONREACTIVE

## 2019-11-23 LAB — IRON AND TIBC
Iron Saturation: 21 % (ref 15–50)
Iron: 72 ug/dL (ref 37–145)
TIBC: 340 ug/dL (ref 260–445)

## 2019-11-23 LAB — HCG, SERUM, QUALITATIVE: hCG Qual: NEGATIVE

## 2019-11-23 LAB — COVID-19, ANTIBODY, TOTAL: SARS-CoV-2 Antibody, Total: NEGATIVE

## 2019-11-28 MED ORDER — FERROUS SULFATE 220 (44 FE) MG/5ML PO ELIX
220 (44 Fe) MG/5ML | Freq: Every day | ORAL | 0 refills | Status: DC
Start: 2019-11-28 — End: 2019-12-02

## 2019-11-28 NOTE — Telephone Encounter (Signed)
Last appointment: 11/22/2019  Next appointment: 05/22/2020  Last refill: 09/27/19

## 2019-12-02 MED ORDER — FERROUS SULFATE 220 (44 FE) MG/5ML PO ELIX
220 (44 Fe) MG/5ML | ORAL | 0 refills | Status: DC
Start: 2019-12-02 — End: 2020-01-30

## 2019-12-02 NOTE — Telephone Encounter (Signed)
Last appointment: 11/22/2019  Next appointment: 05/22/2020  Last refill: 11/28/2019 # 150

## 2019-12-02 NOTE — Telephone Encounter (Signed)
From: [name]  To: Lorrin Goodell, MD  Sent: 11/30/2019 6:33 PM EST  Subject: Non-Urgent Medical Question    Hey! Dr.Youkilis disregard that last message I was asking for the wrong thing; my mother corrected me said I need to be asking for a stress leave from work and also getting put back on depression pills. Starting to experience sleepless nights, anxiety, over thinking and having flashbacks about my grandmother's death. And I been over working myself and getting irritated in the process. If you can email me about this concern I would gladly to hear from you. Again happy Holidays.

## 2019-12-02 NOTE — Telephone Encounter (Signed)
See mychart message and offer patient an appointment.

## 2019-12-03 NOTE — Telephone Encounter (Signed)
Patient Scheduled

## 2019-12-09 ENCOUNTER — Telehealth
Admit: 2019-12-09 | Discharge: 2019-12-09 | Payer: PRIVATE HEALTH INSURANCE | Attending: Internal Medicine | Primary: Internal Medicine

## 2019-12-09 DIAGNOSIS — F32A Depression, unspecified: Secondary | ICD-10-CM

## 2019-12-09 MED ORDER — FLUOXETINE HCL 20 MG PO CAPS
20 MG | ORAL_CAPSULE | Freq: Every day | ORAL | 2 refills | Status: AC
Start: 2019-12-09 — End: ?

## 2019-12-09 NOTE — Telephone Encounter (Signed)
Send 12/18 labs and preop note to:  [facility]  Fax [phone]

## 2019-12-09 NOTE — Telephone Encounter (Signed)
Pre-op history and physical faxed to [phone], Attn:  Rometta Emery

## 2019-12-16 NOTE — Telephone Encounter (Signed)
From: [name]  To: Lorrin Goodell, MD  Sent: 12/16/2019 9:05 AM EST  Subject: Non-Urgent Medical Question    Hey Dr.Youkilis. After speaking with you and talking with HR and my insurance company for my fmla. Is possible to get a 7days/ a month just because of how my schedule is at work it would make more sense; my HR said something about a continuous FMLa but if that is possible that would be perfect my forms should be sent to you as soon as possible. Looking to hear from you soon.

## 2019-12-24 NOTE — Telephone Encounter (Signed)
Good afternoon, Heather Sampson:    Our fax number is [phone].  Hope this is helpful.

## 2019-12-30 ENCOUNTER — Inpatient Hospital Stay
Admit: 2019-12-30 | Discharge: 2019-12-31 | Disposition: A | Discharge status: Home / Self Care | Payer: PRIVATE HEALTH INSURANCE | Attending: Emergency Medicine

## 2019-12-30 DIAGNOSIS — L7633 Postprocedural seroma of skin and subcutaneous tissue following a dermatologic procedure: Secondary | ICD-10-CM

## 2019-12-30 NOTE — Telephone Encounter (Signed)
Dr. Vale Haven advised and he advises patient contact her surgeon in [place] to discuss his recommendations and she will call us back.

## 2019-12-30 NOTE — Telephone Encounter (Signed)
Patient had recent cosmetic surgery done in [facility] on 12/17/2019.  Patient now has area to the right side of her navel that is a fluid pocket.  Patient states it is uncomfortable but no pain.  She went to urgent care yesterday and physician there was unable to drain it as he had "no experience to do it" and patient was advised to contact PCP.

## 2019-12-30 NOTE — Telephone Encounter (Signed)
Spoke with Selena Batten at [facility] and will refax previous paperwork to complete that was illegible when sent previously.

## 2019-12-30 NOTE — Telephone Encounter (Addendum)
Patient called back to advise she spoke with her surgeon in [place], [place] and he recommends" patient have area drained with 30 or 60 cc syringe".    Dr. Vale Haven advised and per his instructions, patient advised to go to ER to have area drained.

## 2020-01-03 NOTE — Telephone Encounter (Signed)
Patient advised FMLA completed and faxed.

## 2020-01-03 NOTE — Telephone Encounter (Signed)
Patient gave me verbal authorization to complete FMLA form which was witnessed by a family member.  She also signed a release form for him for [employer] which she thought was the [employer] [facility] [employer] form.  She does not want me to complete the [employer] form.    I printed an FMLA form found online from [employer] dated 6/20 since the company didn't sent the correct form. I hope this is what they need.

## 2020-01-03 NOTE — Telephone Encounter (Signed)
Noted.

## 2020-01-06 NOTE — Telephone Encounter (Signed)
Please call patient. I think she is trying to get her FMLA forms.

## 2020-01-06 NOTE — Telephone Encounter (Signed)
Left message for patient and advised FMLA paperwork faxed to [facility] @ [phone] and confirmation received 01/03/2020.

## 2020-01-14 NOTE — Telephone Encounter (Signed)
Left message for patient to return call to verify if requesting short term disability.

## 2020-01-27 ENCOUNTER — Encounter: Attending: Obstetrics & Gynecology | Primary: Internal Medicine

## 2020-01-30 MED ORDER — FERROUS SULFATE 220 (44 FE) MG/5ML PO ELIX
220 (44 Fe) MG/5ML | ORAL | 5 refills | Status: AC
Start: 2020-01-30 — End: ?

## 2020-01-30 NOTE — Telephone Encounter (Signed)
Last appointment: 12/09/2019  Next appointment: 02/14/2020  Last refill: 121/28/2020 # 150 with no refills

## 2020-02-10 ENCOUNTER — Encounter: Attending: Obstetrics & Gynecology | Primary: Internal Medicine

## 2020-02-10 NOTE — Telephone Encounter (Signed)
From: [name]  To: Lorrin Goodell, MD  Sent: 02/10/2020 10:07 AM EST  Subject: Non-Urgent Medical Question    Hi! Dr.Youkilis. I'm contacting you via email didn't want to distrub you while in between patients. But this email is in regards to my fmla that was filed in January. My job HR contacted me to inform me I was denied because I guess it was not stating on the paperwork the 7days we talked about requesting off for the next two month (January & February) and than follow up in March. So my HR requested me to get a written statement that I took off using my 7 days thru the dates of January 14-28 because my medical sickness or diagnosis. I'm only requesting this information because my HR wants to reopen the case and get it approved. Talk with you soon.

## 2020-02-14 ENCOUNTER — Ambulatory Visit
Admit: 2020-02-14 | Discharge: 2020-02-14 | Payer: PRIVATE HEALTH INSURANCE | Attending: Internal Medicine | Primary: Internal Medicine

## 2020-02-14 DIAGNOSIS — F32A Depression, unspecified: Secondary | ICD-10-CM

## 2020-02-17 ENCOUNTER — Encounter: Payer: PRIVATE HEALTH INSURANCE | Attending: Psychologist | Primary: Internal Medicine

## 2020-03-23 NOTE — Telephone Encounter (Signed)
Ok for note????

## 2020-04-20 ENCOUNTER — Encounter: Attending: Obstetrics & Gynecology | Primary: Internal Medicine

## 2020-05-22 ENCOUNTER — Encounter: Attending: Internal Medicine | Primary: Internal Medicine

## 2020-06-15 ENCOUNTER — Encounter: Attending: Obstetrics & Gynecology | Primary: Internal Medicine

## 2020-06-22 NOTE — Telephone Encounter (Signed)
From: [name]  To: Estill Batten, MD  Sent: 06/20/2020 8:34 PM EDT  Subject: Non-Urgent Medical Question    Hi! I just received a letter in the mail stating that I will be discharged from the practice because I missed three schedule appointment which I considered that being false because to my previous schedule appointments was rescheduled due to Dr. Yvette Rack not being there and my recent appointment on 06/15/20 at 4:40pm I showed up 10 minutes late they asked me to reschedule which was no problem ... so if someone can please get back to me to explain this letter on why am I being discharged from the practice and also to it is shown on my chart that I was a no show when I did show up and the receptionist told me to wait one second so she could ask the doctor if I could be seen and I was told that I had to reschedule so I would like to know what's going on

## 2020-10-01 ENCOUNTER — Encounter

## 2020-10-01 ENCOUNTER — Inpatient Hospital Stay: Admit: 2020-10-01 | Discharge: 2020-10-01 | Disposition: A | Discharge status: Home / Self Care

## 2020-10-01 DIAGNOSIS — R1032 Left lower quadrant pain: Secondary | ICD-10-CM

## 2020-10-01 LAB — CHLAMYDIA / GONORRHOEAE DNA SWAB
Chlamydia Trachomatis DNA Swab: NEGATIVE
Neisseria gonorrhoeae DNA Swab: NEGATIVE

## 2020-10-01 LAB — DIFFERENTIAL
Basophils Absolute: 34 /uL (ref 0–200)
Basophils Relative: 0.6 % (ref 0.0–1.0)
Eosinophils Absolute: 74 /uL (ref 15–500)
Eosinophils Relative: 1.3 % (ref 0.0–8.0)
Lymphocytes Absolute: 2138 /uL (ref 850–3900)
Lymphocytes Relative: 37.5 % (ref 15.0–45.0)
Monocytes Absolute: 530 /uL (ref 200–950)
Monocytes Relative: 9.3 % (ref 0.0–12.0)
Neutrophils Absolute: 2924 /uL (ref 1500–7800)
Neutrophils Relative: 51.3 % (ref 40.0–80.0)
nRBC: 0 /100 WBC (ref 0–0)

## 2020-10-01 LAB — BASIC METABOLIC PANEL
Anion Gap: 7 mmol/L (ref 3–16)
BUN: 10 mg/dL (ref 7–25)
CO2: 28 mmol/L (ref 21–33)
Calcium: 9.4 mg/dL (ref 8.6–10.3)
Chloride: 101 mmol/L (ref 98–110)
Creatinine: 0.59 mg/dL (ref 0.60–1.30)
Glucose: 91 mg/dL (ref 70–100)
Osmolality, Calculated: 281 mOsm/kg (ref 278–305)
Potassium: 3.9 mmol/L (ref 3.5–5.3)
Sodium: 136 mmol/L (ref 133–146)
eGFR AA CKD-EPI: >90 See note.
eGFR NONAA CKD-EPI: >90 See note.

## 2020-10-01 LAB — CBC
Hematocrit: 36.3 % (ref 35.0–45.0)
Hemoglobin: 12.5 g/dL (ref 11.7–15.5)
MCH: 30.9 pg (ref 27.0–33.0)
MCHC: 34.3 g/dL (ref 32.0–36.0)
MCV: 90 fL (ref 80.0–100.0)
MPV: 7.7 fL (ref 7.5–11.5)
Platelets: 354 10E3/uL (ref 140–400)
RBC: 4.04 10E6/uL (ref 3.80–5.10)
RDW: 14.2 % (ref 11.0–15.0)
WBC: 5.7 10E3/uL (ref 3.8–10.8)

## 2020-10-01 LAB — URINALYSIS W/RFL TO MICROSCOPIC
Bilirubin, UA: NEGATIVE
Blood, UA: NEGATIVE
Glucose, UA: NEGATIVE mg/dL
Ketones, UA: NEGATIVE mg/dL
Nitrite, UA: NEGATIVE
Protein, UA: NEGATIVE mg/dL
RBC, UA: 5 /HPF — ABNORMAL HIGH (ref 0–3)
Specific Gravity, UA: 1.025 (ref 1.005–1.035)
Squam Epithel, UA: 14 /HPF — ABNORMAL HIGH (ref 0–5)
Urobilinogen, UA: 2 mg/dL — ABNORMAL HIGH (ref 0.2–1.9)
WBC, UA: 6 /HPF — ABNORMAL HIGH (ref 0–5)
pH, UA: 6 (ref 5.0–8.0)

## 2020-10-01 LAB — HEPATIC FUNCTION PANEL
ALT: 7 U/L (ref 7–52)
AST: 12 U/L (ref 13–39)
Albumin: 4.5 g/dL (ref 3.5–5.7)
Alkaline Phosphatase: 58 U/L (ref 36–125)
Bilirubin, Direct: 0.1 mg/dL (ref 0.0–0.4)
Bilirubin, Indirect: 0.5 mg/dL (ref 0.0–1.1)
Total Bilirubin: 0.6 mg/dL (ref 0.0–1.5)
Total Protein: 7.1 g/dL (ref 6.4–8.9)

## 2020-10-01 LAB — TRICHOMONAS SCREEN
Clue Cells, Wet Prep: ABSENT
Trich, Wet Prep: ABSENT
Yeast, Wet Prep: ABSENT

## 2020-10-01 LAB — LIPASE: Lipase: 13 U/L (ref 4–82)

## 2020-10-01 LAB — HCG URINE, QUALITATIVE: hCG Qualitative -Clinitek: NEGATIVE

## 2020-10-01 MED ORDER — ketorolac (TORADOL) injection 15 mg
15 | Freq: Once | INTRAMUSCULAR | Status: AC
Start: 2020-10-01 — End: 2020-10-01
  Administered 2020-10-01: 16:00:00 15 mg via INTRAVENOUS

## 2020-10-01 MED ORDER — ibuprofen (MOTRIN) 800 MG tablet
800 | ORAL_TABLET | Freq: Three times a day (TID) | ORAL | 0 refills | Status: AC | PRN
Start: 2020-10-01 — End: ?

## 2020-10-01 MED ORDER — ondansetron (ZOFRAN ODT) 4 MG disintegrating tablet
4 | ORAL_TABLET | Freq: Three times a day (TID) | ORAL | 0 refills | Status: AC | PRN
Start: 2020-10-01 — End: ?

## 2020-10-01 MED ORDER — ondansetron (ZOFRAN) injection 4 mg
4 | Freq: Once | INTRAMUSCULAR | Status: AC
Start: 2020-10-01 — End: 2020-10-01
  Administered 2020-10-01: 15:00:00 4 mg via INTRAVENOUS

## 2020-10-01 MED ORDER — lactated Ringers 1,000 mL IV fluid
Freq: Once | INTRAVENOUS | Status: AC
Start: 2020-10-01 — End: 2020-10-01
  Administered 2020-10-01: 15:00:00 via INTRAVENOUS

## 2020-10-01 MED FILL — KETOROLAC 15 MG/ML INJECTION SOLUTION: 15 15 mg/mL | INTRAMUSCULAR | Qty: 1

## 2020-10-01 MED FILL — LACTATED RINGERS INTRAVENOUS SOLUTION: INTRAVENOUS | Qty: 1000

## 2020-10-01 MED FILL — ONDANSETRON HCL (PF) 4 MG/2 ML INJECTION SOLUTION: 4 4 mg/2 mL | INTRAMUSCULAR | Qty: 2

## 2020-10-01 NOTE — Unmapped (Signed)
ED Attending Attestation Note    Date of service:  10/01/2020    This patient was seen by the resident physician.  I have seen and examined the patient, agree with the workup, evaluation, management and diagnosis. The care plan has been discussed and I concur.     My assessment reveals a 34 y.o. female presenting with LLQ and flank pain since three days ago. She reports surgery for uterine cysts in the past. She denies vomiting but endorses nausea. She denies dairrhea, cough congestion, blood in stools.    On exam, there is LLQ and suprapubic tenderness to palpation, no rebound, no guarding. No right side tenderness to palpation, no rebound, no guarding.    By signing my name below, I, Cleatrice Burke, attest that this documentation has been prepared under the direction and in the presence of Sinda Du, MD  Scribe name: Cleatrice Burke Date: 10/01/2020

## 2020-10-01 NOTE — Unmapped (Signed)
[facility] ED Note    Date of Service: 10/01/2020  Reason for Visit: Abdominal Pain      Patient History     HPI  Sophia Pittman is a 34 y.o. female with past medical history of depression who presents to the emergency department with chief complaint of abdominal pain.  Patient reports that she has had pain in her left lower quadrant and flank for the past 3 days.  She reports that it is constant and describes it as a pressure.  She is attempted ibuprofen without relief of the pain.  She reports difficulty urinating secondary to pain.  Denies dysuria or hematuria.  Endorses nausea without emesis.  Endorses normal bowel movements without diarrhea, constipation, or bloody stools.  No fevers, chills, cough.  Denies vaginal discharge.  Last menstrual period was October 14.  Is currently sexually active, not using condoms or other contraception.    Other than stated above, no additional aggravating or alleviating factors are noted.    Past Medical History:   Diagnosis Date   ??? Depression      Past Surgical History:   Procedure Laterality Date   ??? CYSTOSCOPY W/ URETEROSCOPY W/ LITHOTRIPSY Right 11/12/2015    Procedure: CYSTOSCOPY W/ RIGHT URETEROSCOPY W/ STONE BASKETING, RIGHT URETER STENT REMOVAL;  Surgeon: Judd Lien, MD;  Location: [facility] OR;  Service: Urology;  Laterality: Right;     Patient  reports that she has been smoking cigars and e-cigs/vape. She has never used smokeless tobacco. She reports current alcohol use. She reports that she does not use drugs.  Previous Medications    HYDROXYZINE PAMOATE (VISTARIL) 50 MG CAPSULE    Take 1 capsule (50 mg total) by mouth every 3 hours as needed for Itching. Indications: anxiety, SLEEP    MELATONIN 3 MG TAB    Take 1 tablet (3 mg total) by mouth at bedtime. Indications: SLEEP    OXYBUTYNIN (DITROPAN) 5 MG TABLET    Take 1 tablet (5 mg total) by mouth 3 times a day as needed (Bladder spasms).       Allergies:   Allergies as  of 10/01/2020   ??? (No Known Allergies)       All nursing notes and triage notes were appropriately reviewed in the course of the creation of this note.     Review of Systems     Please see HPI for pertinent positives and negatives.  All other systems are negative except as mentioned in HPI.  Physical Exam     Vitals:    10/01/20 1003   BP: 125/77   BP Location: Right arm   Patient Position: Sitting   Pulse: 65   Resp: 16   Temp: 98.5 ??F (36.9 ??C)   TempSrc: Oral   SpO2: 95%   Weight: 170 lb (77.1 kg)   Height: 5' 6 (1.676 m)     General:  Well appearing. No acute distress  Eyes:  Pupils reactive. No discharge from eyes   ENT:  No discharge from nose. OP clear  Neck:  Supple, trachea midline  Pulmonary:   Non-labored breathing. Breath sounds clear bilaterally  Cardiac:  Regular rate and rhythm. No murmurs  Abdomen:  Soft.  Tenderness to palpation in the left lower quadrant without rebound, guarding, rigidity.  No CVA tenderness appreciated.  Musculoskeletal:  No long bone deformity.   Vascular:  Extremities warm and perfused. Normal pulses at radial sites  Skin:  Dry, no rashes  Extremities:  No peripheral edema  Neuro:  Alert. Moves all four extremities to command. No focal deficit  GU: Normal external female genitalia without rashes or lesions.  White discharge noted in the vaginal vault.  The cervical os is closed to palpation.  There is no cervical motion tenderness.  There is no adnexal tenderness or masses appreciated.    Diagnostic Studies     Labs:  Labs Reviewed   BASIC METABOLIC PANEL   CBC   DIFFERENTIAL   HEPATIC FUNCTION PANEL   LIPASE   URINALYSIS W/RFL TO MICROSCOPIC   HCG URINE, QUALITATIVE   CHLAMYDIA / GONORRHOEAE DNA SWAB       Radiology:  No orders to display       EKG:  No EKG Performed  Emergency Department Procedures   None    ED Course and MDM     Sophia Pittman is a 34 y.o. female with a history and presentation as described above in HPI.  The patient was evaluated by myself and the ED  Attending Physician, Dr. Carey Bullocks. All management and disposition plans were discussed and agreed upon.    Upon presentation, the patient was well-appearing, afebrile and hemodynamically stable.  She presented with left flank pain.  Abdominal exam was reassuring without peritoneal signs.  No CVA tenderness noted on examination.  Laboratory studies were performed to assess for acute abnormality.  Pregnancy test was negative.  Patient was afebrile without leukocytosis noted on CBC.  LFTs and lipase are reassuring.    Urinalysis did not appear infected; demonstrated 6 white blood cells but 14 squamous cells.  Wet prep was negative.  Gonorrhea and Chlamydia testing are pending.  Patient reported symptom improvement after Toradol and Zofran.  She remained well-appearing in no acute distress.    Based on my evaluation; the patient has a benign abdominal exam without signs of peritonitis. There is no history concerning for acute UGIB or LGIB. Exam findings and lack of fever or marked leukocytosis lower my suspicion for an acute infectious process such as cholecystitis, cholangitis, appendicitis, diverticulitis or pyelonephritis. LFTs and lipase were reassuring, and thus acute hepatitis and pancreatitis are unlikely. Clinical history is not suggestive of nephrolithiasis. No adnexal tenderness to suggest torsion. History and exam without evidence of pulsatile mass or specific risk factors for ruptured AAA. Similarly, mesenteric ischemia considered but again unlikely based on H&P and risk factor profile. The patient is tolerating PO intake and has no signs of bowel obstruction.  Patient's pain was controlled with toradol while in the emergency department. Patient will be discharged with instructions for PCP follow up and return precautions.           Medications received during this ED visit:  Medications   ketorolac (TORADOL) injection 15 mg (has no administration in time range)   ondansetron (ZOFRAN) injection 4 mg (has no  administration in time range)   lactated Ringers 1,000 mL IV fluid (has no administration in time range)       At this time, the patient was deemed appropriate for discharge. My customary discharge instructions, including strict return precautions for new or worsening symptoms concerning to the patient, were provided. All of patient's questions were answered satisfactorily, and she was subsequently sent home in stable condition.      Impression     1. Left flank pain       Plan     1. The patient is to be discharged home in stable/improved condition.  2. Workup, treatment and diagnosis were discussed with the  patient and/or family members; the patient agrees to the plan and all questions were addressed and answered.  3. The patient is instructed to return to the emergency department should her symptoms worsen or any concern she believes warrants acute physician evaluation.      Isabella Stalling, MD, PGY-3  [facility]    Critical Care Time (Attendings)            Isabella Stalling, MD  10/01/20 1343

## 2020-10-01 NOTE — Unmapped (Signed)
Pt d/c instructoins and prescription given. Pt verbalized understanding, vss. Steady gait ambulated. PIV d/c

## 2020-10-01 NOTE — Unmapped (Signed)
L lower abd pain x 3 days. No fever  + nausea  No V/D  + SOB  02 sat not able to pick up due to nail length.  Pain 9/10

## 2020-10-01 NOTE — Unmapped (Signed)
You were evaluated today for abdominal pain. Our testing today did not reveal a definite cause for your pain. You should adhere to the recommendations given by the doctor, including avoiding spicy food, alcohol, and coffee. Drink plenty of fluids, at least 6-8 glasses of water per day.  Be sure to eat plenty of fiber.     Follow-up with a primary care doctor within 1 week should you continue to experience your symptoms.  You should return to the ER if you develop any of the following: fevers, decreased level of consciousness, worsening pain, a significant change in the nature of the pain, blood in your vomit or stool, inability to stay hydrated, or any new severe symptom.

## 2020-10-01 NOTE — Telephone Encounter (Signed)
Called and spoke to patient, informed patient that Dr Yvette Rack will be out of office and if she has to go to ER go to [facility] since Dr Emerson Monte is covering for her. Also informed her that Dr Yvette Rack wanted her to have a Pelvic US done. Placed order in schedule and also gave patient phone number to call and schedule her [place]

## 2020-10-01 NOTE — Telephone Encounter (Signed)
Patient was seen today in ER at [facility] for left lower quadrant pain onset 3 days ago, constant pain, sharp pain level from 8-9 now with pain meds still a 5. Patient  due for pap please review and advise, patient can be reached at [phone]

## 2020-10-01 NOTE — Telephone Encounter (Signed)
You can order her a pelvic ultrasound. Make sure she is not pregnant.     Keep chart open so we can add her on a cancellation slot. Tell her I am gone for a week.   If her pain gets worse, she will need to go back to ER. If she has to go to ER, [facility] would be better due to my partner covering.

## 2020-10-04 NOTE — Telephone Encounter (Signed)
Please offer patient appt to FU recent ER visit at [facility]

## 2020-10-05 NOTE — Telephone Encounter (Signed)
No, if being addressed by GYN

## 2020-10-05 NOTE — Telephone Encounter (Signed)
Spoke with patient. States she was at the hospital for pelvic pain and has a following scheduled with gynecology. Does she still need to be seen here for hospital f/u?

## 2020-10-06 ENCOUNTER — Ambulatory Visit: Payer: PRIVATE HEALTH INSURANCE | Primary: Internal Medicine

## 2020-10-14 ENCOUNTER — Ambulatory Visit: Payer: PRIVATE HEALTH INSURANCE | Primary: Internal Medicine

## 2020-10-22 ENCOUNTER — Inpatient Hospital Stay: Admit: 2020-10-22 | Primary: Internal Medicine

## 2020-10-22 ENCOUNTER — Ambulatory Visit: Payer: PRIVATE HEALTH INSURANCE | Primary: Internal Medicine

## 2020-10-22 DIAGNOSIS — R102 Pelvic and perineal pain: Secondary | ICD-10-CM

## 2021-08-11 DIAGNOSIS — L239 Allergic contact dermatitis, unspecified cause: Secondary | ICD-10-CM | POA: Diagnosis not present

## 2021-09-09 DIAGNOSIS — R1030 Lower abdominal pain, unspecified: Secondary | ICD-10-CM | POA: Diagnosis not present

## 2021-09-09 DIAGNOSIS — O99891 Other specified diseases and conditions complicating pregnancy: Secondary | ICD-10-CM | POA: Diagnosis not present

## 2021-09-09 DIAGNOSIS — O26891 Other specified pregnancy related conditions, first trimester: Secondary | ICD-10-CM | POA: Diagnosis not present

## 2021-09-09 DIAGNOSIS — R109 Unspecified abdominal pain: Secondary | ICD-10-CM | POA: Diagnosis not present

## 2021-09-09 DIAGNOSIS — Z3A01 Less than 8 weeks gestation of pregnancy: Secondary | ICD-10-CM | POA: Diagnosis not present

## 2021-09-09 DIAGNOSIS — O2341 Unspecified infection of urinary tract in pregnancy, first trimester: Secondary | ICD-10-CM | POA: Diagnosis not present

## 2021-09-16 DIAGNOSIS — N898 Other specified noninflammatory disorders of vagina: Secondary | ICD-10-CM | POA: Diagnosis not present

## 2021-09-16 DIAGNOSIS — Z3201 Encounter for pregnancy test, result positive: Secondary | ICD-10-CM | POA: Diagnosis not present

## 2021-10-07 DIAGNOSIS — R87810 Cervical high risk human papillomavirus (HPV) DNA test positive: Secondary | ICD-10-CM | POA: Diagnosis not present

## 2021-10-08 DIAGNOSIS — D259 Leiomyoma of uterus, unspecified: Secondary | ICD-10-CM | POA: Diagnosis not present

## 2021-10-08 DIAGNOSIS — O09521 Supervision of elderly multigravida, first trimester: Secondary | ICD-10-CM | POA: Diagnosis not present

## 2021-10-08 DIAGNOSIS — O3680X1 Pregnancy with inconclusive fetal viability, fetus 1: Secondary | ICD-10-CM | POA: Diagnosis not present

## 2021-10-08 DIAGNOSIS — O3411 Maternal care for benign tumor of corpus uteri, first trimester: Secondary | ICD-10-CM | POA: Diagnosis not present

## 2021-10-26 DIAGNOSIS — O209 Hemorrhage in early pregnancy, unspecified: Secondary | ICD-10-CM | POA: Diagnosis not present

## 2021-10-26 DIAGNOSIS — O99891 Other specified diseases and conditions complicating pregnancy: Secondary | ICD-10-CM | POA: Diagnosis not present

## 2021-10-26 DIAGNOSIS — R109 Unspecified abdominal pain: Secondary | ICD-10-CM | POA: Diagnosis not present

## 2021-10-26 DIAGNOSIS — O26891 Other specified pregnancy related conditions, first trimester: Secondary | ICD-10-CM | POA: Diagnosis not present

## 2021-10-26 DIAGNOSIS — Z3A11 11 weeks gestation of pregnancy: Secondary | ICD-10-CM | POA: Diagnosis not present

## 2021-10-26 DIAGNOSIS — M545 Low back pain, unspecified: Secondary | ICD-10-CM | POA: Diagnosis not present

## 2021-11-05 DIAGNOSIS — O26851 Spotting complicating pregnancy, first trimester: Secondary | ICD-10-CM | POA: Diagnosis not present

## 2021-11-05 DIAGNOSIS — O418X10 Other specified disorders of amniotic fluid and membranes, first trimester, not applicable or unspecified: Secondary | ICD-10-CM | POA: Diagnosis not present

## 2021-11-05 DIAGNOSIS — Z3A12 12 weeks gestation of pregnancy: Secondary | ICD-10-CM | POA: Diagnosis not present

## 2021-11-05 DIAGNOSIS — Z3481 Encounter for supervision of other normal pregnancy, first trimester: Secondary | ICD-10-CM | POA: Diagnosis not present

## 2021-11-05 DIAGNOSIS — O43191 Other malformation of placenta, first trimester: Secondary | ICD-10-CM | POA: Diagnosis not present

## 2021-11-05 DIAGNOSIS — Z3401 Encounter for supervision of normal first pregnancy, first trimester: Secondary | ICD-10-CM | POA: Diagnosis not present

## 2021-11-05 DIAGNOSIS — Z9889 Other specified postprocedural states: Secondary | ICD-10-CM | POA: Diagnosis not present

## 2021-11-05 DIAGNOSIS — O468X1 Other antepartum hemorrhage, first trimester: Secondary | ICD-10-CM | POA: Diagnosis not present

## 2021-11-24 DIAGNOSIS — J069 Acute upper respiratory infection, unspecified: Secondary | ICD-10-CM | POA: Diagnosis not present

## 2021-11-24 DIAGNOSIS — B9789 Other viral agents as the cause of diseases classified elsewhere: Secondary | ICD-10-CM | POA: Diagnosis not present

## 2021-12-31 DIAGNOSIS — Z3689 Encounter for other specified antenatal screening: Secondary | ICD-10-CM | POA: Diagnosis not present

## 2021-12-31 DIAGNOSIS — O99213 Obesity complicating pregnancy, third trimester: Secondary | ICD-10-CM | POA: Diagnosis not present

## 2021-12-31 DIAGNOSIS — O99212 Obesity complicating pregnancy, second trimester: Secondary | ICD-10-CM | POA: Diagnosis not present

## 2022-01-28 DIAGNOSIS — Z362 Encounter for other antenatal screening follow-up: Secondary | ICD-10-CM | POA: Diagnosis not present

## 2022-01-28 DIAGNOSIS — O99212 Obesity complicating pregnancy, second trimester: Secondary | ICD-10-CM | POA: Diagnosis not present

## 2022-02-08 DIAGNOSIS — M9903 Segmental and somatic dysfunction of lumbar region: Secondary | ICD-10-CM | POA: Diagnosis not present

## 2022-02-08 DIAGNOSIS — M546 Pain in thoracic spine: Secondary | ICD-10-CM | POA: Diagnosis not present

## 2022-02-08 DIAGNOSIS — M9902 Segmental and somatic dysfunction of thoracic region: Secondary | ICD-10-CM | POA: Diagnosis not present

## 2022-02-08 DIAGNOSIS — M5451 Vertebrogenic low back pain: Secondary | ICD-10-CM | POA: Diagnosis not present

## 2022-02-09 DIAGNOSIS — M546 Pain in thoracic spine: Secondary | ICD-10-CM | POA: Diagnosis not present

## 2022-02-09 DIAGNOSIS — M9903 Segmental and somatic dysfunction of lumbar region: Secondary | ICD-10-CM | POA: Diagnosis not present

## 2022-02-09 DIAGNOSIS — M5451 Vertebrogenic low back pain: Secondary | ICD-10-CM | POA: Diagnosis not present

## 2022-02-09 DIAGNOSIS — M9902 Segmental and somatic dysfunction of thoracic region: Secondary | ICD-10-CM | POA: Diagnosis not present

## 2022-02-11 DIAGNOSIS — M9903 Segmental and somatic dysfunction of lumbar region: Secondary | ICD-10-CM | POA: Diagnosis not present

## 2022-02-11 DIAGNOSIS — M9902 Segmental and somatic dysfunction of thoracic region: Secondary | ICD-10-CM | POA: Diagnosis not present

## 2022-02-11 DIAGNOSIS — M546 Pain in thoracic spine: Secondary | ICD-10-CM | POA: Diagnosis not present

## 2022-02-11 DIAGNOSIS — M5451 Vertebrogenic low back pain: Secondary | ICD-10-CM | POA: Diagnosis not present

## 2022-02-17 DIAGNOSIS — M546 Pain in thoracic spine: Secondary | ICD-10-CM | POA: Diagnosis not present

## 2022-02-17 DIAGNOSIS — M9902 Segmental and somatic dysfunction of thoracic region: Secondary | ICD-10-CM | POA: Diagnosis not present

## 2022-02-17 DIAGNOSIS — M5451 Vertebrogenic low back pain: Secondary | ICD-10-CM | POA: Diagnosis not present

## 2022-02-17 DIAGNOSIS — M9903 Segmental and somatic dysfunction of lumbar region: Secondary | ICD-10-CM | POA: Diagnosis not present

## 2022-02-21 DIAGNOSIS — Z23 Encounter for immunization: Secondary | ICD-10-CM | POA: Diagnosis not present

## 2022-02-22 DIAGNOSIS — M9903 Segmental and somatic dysfunction of lumbar region: Secondary | ICD-10-CM | POA: Diagnosis not present

## 2022-02-22 DIAGNOSIS — M546 Pain in thoracic spine: Secondary | ICD-10-CM | POA: Diagnosis not present

## 2022-02-22 DIAGNOSIS — M9902 Segmental and somatic dysfunction of thoracic region: Secondary | ICD-10-CM | POA: Diagnosis not present

## 2022-02-22 DIAGNOSIS — M5451 Vertebrogenic low back pain: Secondary | ICD-10-CM | POA: Diagnosis not present

## 2022-03-07 DIAGNOSIS — Z713 Dietary counseling and surveillance: Secondary | ICD-10-CM | POA: Diagnosis not present

## 2022-03-07 DIAGNOSIS — Z3A29 29 weeks gestation of pregnancy: Secondary | ICD-10-CM | POA: Diagnosis not present

## 2022-03-07 DIAGNOSIS — O24410 Gestational diabetes mellitus in pregnancy, diet controlled: Secondary | ICD-10-CM | POA: Diagnosis not present

## 2022-03-16 DIAGNOSIS — O24414 Gestational diabetes mellitus in pregnancy, insulin controlled: Secondary | ICD-10-CM | POA: Diagnosis not present

## 2022-04-04 DIAGNOSIS — O34211 Maternal care for low transverse scar from previous cesarean delivery: Secondary | ICD-10-CM | POA: Diagnosis not present

## 2022-04-04 DIAGNOSIS — O24414 Gestational diabetes mellitus in pregnancy, insulin controlled: Secondary | ICD-10-CM | POA: Diagnosis not present

## 2022-04-04 DIAGNOSIS — Z3A33 33 weeks gestation of pregnancy: Secondary | ICD-10-CM | POA: Diagnosis not present

## 2022-04-04 DIAGNOSIS — O09893 Supervision of other high risk pregnancies, third trimester: Secondary | ICD-10-CM | POA: Diagnosis not present
